# Patient Record
Sex: Female | Born: 2000 | Race: White | Hispanic: Yes | Marital: Married | Smoking: Never smoker
Health system: Southern US, Community
[De-identification: ages and names within clinical notes are randomized; demographics above are authoritative.]

## PROBLEM LIST (undated history)

## (undated) DIAGNOSIS — Z789 Other specified health status: Secondary | ICD-10-CM

## (undated) DIAGNOSIS — N200 Calculus of kidney: Secondary | ICD-10-CM

## (undated) HISTORY — PX: NO PAST SURGERIES: SHX2092

---

## 2020-05-29 NOTE — L&D Delivery Note (Signed)
Delivery Note Labor onset: 05/13/2021 Labor Onset Time: 1400 Complete dilation at 3:05 AM  Onset of pushing at 0305 FHR second stage Cat 1 Analgesia/Anesthesia intrapartum: epidural  Guided pushing with maternal urge. Delivery of a viable female at 74. Fetal head delivered in ROA position.  Nuchal cord: none.  Infant placed on maternal abd, dried, and tactile stim.  Cord double clamped after 45 seconds to bring baby to warmer and cut by mom.  2RNs present for birth.  Cord blood sample collected: yes Arterial cord blood sample collected: no  Placenta delivered Schultz intact, with 3 VC.  Placenta to labor and delivery. Uterine tone firm, bleeding light  Second degree laceration identified.  Anesthesia: epidural Repair: Repaired with 3-0 Vicryl in usual fashion QBL/EBL (mL): 250 Complications: none APGAR: APGAR (1 MIN): 6   APGAR (5 MINS): 8   APGAR (10 MINS):   Mom to postpartum.  Baby to Couplet care / Skin to Skin.  Oley Balm MSN, CNM 05/13/2021, 4:56 AM

## 2020-11-16 LAB — OB RESULTS CONSOLE HEPATITIS B SURFACE ANTIGEN: Hepatitis B Surface Ag: NEGATIVE

## 2020-11-16 LAB — OB RESULTS CONSOLE HIV ANTIBODY (ROUTINE TESTING): HIV: NONREACTIVE

## 2020-11-16 LAB — HEPATITIS C ANTIBODY: HCV Ab: NEGATIVE

## 2020-11-16 LAB — OB RESULTS CONSOLE RUBELLA ANTIBODY, IGM: Rubella: IMMUNE

## 2020-11-19 LAB — OB RESULTS CONSOLE GC/CHLAMYDIA
Chlamydia: NEGATIVE
Gonorrhea: NEGATIVE

## 2020-12-02 ENCOUNTER — Inpatient Hospital Stay (HOSPITAL_COMMUNITY): Admission: AD | Admit: 2020-12-02 | Payer: Self-pay | Source: Home / Self Care | Admitting: Obstetrics and Gynecology

## 2021-02-17 LAB — OB RESULTS CONSOLE RPR: RPR: NONREACTIVE

## 2021-03-06 ENCOUNTER — Other Ambulatory Visit: Payer: Self-pay

## 2021-03-06 ENCOUNTER — Inpatient Hospital Stay (HOSPITAL_COMMUNITY)

## 2021-03-06 ENCOUNTER — Encounter (HOSPITAL_COMMUNITY): Payer: Self-pay | Admitting: Obstetrics and Gynecology

## 2021-03-06 ENCOUNTER — Inpatient Hospital Stay (HOSPITAL_COMMUNITY)
Admission: AD | Admit: 2021-03-06 | Discharge: 2021-03-06 | Disposition: A | Discharge status: Home / Self Care | Attending: Obstetrics and Gynecology | Admitting: Obstetrics and Gynecology

## 2021-03-06 DIAGNOSIS — R1011 Right upper quadrant pain: Secondary | ICD-10-CM

## 2021-03-06 DIAGNOSIS — Z3A30 30 weeks gestation of pregnancy: Secondary | ICD-10-CM | POA: Diagnosis not present

## 2021-03-06 DIAGNOSIS — R1010 Upper abdominal pain, unspecified: Secondary | ICD-10-CM | POA: Diagnosis present

## 2021-03-06 DIAGNOSIS — O26833 Pregnancy related renal disease, third trimester: Secondary | ICD-10-CM | POA: Insufficient documentation

## 2021-03-06 DIAGNOSIS — N200 Calculus of kidney: Secondary | ICD-10-CM | POA: Diagnosis not present

## 2021-03-06 HISTORY — DX: Other specified health status: Z78.9

## 2021-03-06 LAB — URINALYSIS, ROUTINE W REFLEX MICROSCOPIC
Bilirubin Urine: NEGATIVE
Glucose, UA: NEGATIVE mg/dL
Ketones, ur: 5 mg/dL — AB
Nitrite: NEGATIVE
Protein, ur: 30 mg/dL — AB
RBC / HPF: >50 RBC/hpf — ABNORMAL HIGH (ref 0–5)
Specific Gravity, Urine: 1.016 (ref 1.005–1.030)
pH: 7 (ref 5.0–8.0)

## 2021-03-06 LAB — CBC WITH DIFFERENTIAL/PLATELET
Abs Immature Granulocytes: 0.05 10*3/uL (ref 0.00–0.07)
Basophils Absolute: 0 10*3/uL (ref 0.0–0.1)
Basophils Relative: 0 %
Eosinophils Absolute: 0.1 10*3/uL (ref 0.0–0.5)
Eosinophils Relative: 1 %
HCT: 34.3 % — ABNORMAL LOW (ref 36.0–46.0)
Hemoglobin: 11.7 g/dL — ABNORMAL LOW (ref 12.0–15.0)
Immature Granulocytes: 1 %
Lymphocytes Relative: 15 %
Lymphs Abs: 1.2 10*3/uL (ref 0.7–4.0)
MCH: 31.4 pg (ref 26.0–34.0)
MCHC: 34.1 g/dL (ref 30.0–36.0)
MCV: 92 fL (ref 80.0–100.0)
Monocytes Absolute: 0.8 10*3/uL (ref 0.1–1.0)
Monocytes Relative: 9 %
Neutro Abs: 6.3 10*3/uL (ref 1.7–7.7)
Neutrophils Relative %: 74 %
Platelets: 246 10*3/uL (ref 150–400)
RBC: 3.73 MIL/uL — ABNORMAL LOW (ref 3.87–5.11)
RDW: 12.1 % (ref 11.5–15.5)
WBC: 8.4 10*3/uL (ref 4.0–10.5)
nRBC: 0 % (ref 0.0–0.2)

## 2021-03-06 LAB — COMPREHENSIVE METABOLIC PANEL
ALT: 14 U/L (ref 0–44)
AST: 19 U/L (ref 15–41)
Albumin: 2.8 g/dL — ABNORMAL LOW (ref 3.5–5.0)
Alkaline Phosphatase: 82 U/L (ref 38–126)
Anion gap: 8 (ref 5–15)
BUN: 6 mg/dL (ref 6–20)
CO2: 23 mmol/L (ref 22–32)
Calcium: 8.9 mg/dL (ref 8.9–10.3)
Chloride: 103 mmol/L (ref 98–111)
Creatinine, Ser: 0.47 mg/dL (ref 0.44–1.00)
GFR, Estimated: >60 mL/min (ref >60)
Glucose, Bld: 109 mg/dL — ABNORMAL HIGH (ref 70–99)
Potassium: 4.2 mmol/L (ref 3.5–5.1)
Sodium: 134 mmol/L — ABNORMAL LOW (ref 135–145)
Total Bilirubin: 0.7 mg/dL (ref 0.3–1.2)
Total Protein: 6.1 g/dL — ABNORMAL LOW (ref 6.5–8.1)

## 2021-03-06 LAB — LIPASE, BLOOD: Lipase: 24 U/L (ref 11–51)

## 2021-03-06 LAB — AMYLASE: Amylase: 68 U/L (ref 28–100)

## 2021-03-06 MED ORDER — ACETAMINOPHEN 500 MG PO TABS
1000.0000 mg | ORAL_TABLET | Freq: Once | ORAL | Status: AC
  Administered 2021-03-06: 1000 mg via ORAL
  Filled 2021-03-06: qty 2

## 2021-03-06 MED ORDER — TAMSULOSIN HCL 0.4 MG PO CAPS
0.4000 mg | ORAL_CAPSULE | Freq: Every day | ORAL | 0 refills | Status: DC
Start: 2021-03-06 — End: 2021-05-14

## 2021-03-06 MED ORDER — CYCLOBENZAPRINE HCL 5 MG PO TABS
10.0000 mg | ORAL_TABLET | Freq: Once | ORAL | Status: AC
  Administered 2021-03-06: 10 mg via ORAL
  Filled 2021-03-06: qty 2

## 2021-03-06 MED ORDER — OXYCODONE-ACETAMINOPHEN 5-325 MG PO TABS: 1.0000 | ORAL_TABLET | Freq: Four times a day (QID) | ORAL | 0 refills | Status: DC | PRN

## 2021-03-06 NOTE — MAU Note (Signed)
Pt reports to mau with c/o right sided pain that radiates around to her mid back.  Pain is constant.  Pt reports the pain started about 4 hours ago.  Pt states she noticed her urine looked bloody when using the restroom in mau.  Pt states she also noticed a small amount of green dc when she wipes. Denies ctx or LOF. Denies vag bleeding +FM

## 2021-03-06 NOTE — Discharge Instructions (Signed)

## 2021-03-06 NOTE — MAU Provider Note (Signed)
History     CSN: [id number]  Arrival date and time: 03/06/21 1301   Event Date/Time   First Provider Initiated Contact with Patient 03/06/21 1413      Chief Complaint  Patient presents with   Abdominal Pain   HPI Tricia Mayo is a 20 y.o. G1P0 at [redacted]w[redacted]d who presents with severe upper abdominal pain. She states 4 hours ago she had a sudden onset of severe pain. She rates the pain a 8/10 and has not tried anything for the pain. She reports she ate an hour ago and it did not help or make the pain worse. She denies any bleeding or leaking. Reports normal fetal movement.   OB History     Gravida  1   Para      Term      Preterm      AB      Living         SAB      IAB      Ectopic      Multiple      Live Births              Past Medical History:  Diagnosis Date   Medical history non-contributory     Past Surgical History:  Procedure Laterality Date   NO PAST SURGERIES      No family history on file.  Social History   Tobacco Use   Smoking status: Never   Smokeless tobacco: Never  Vaping Use   Vaping Use: Never used  Substance Use Topics   Alcohol use: Not Currently   Drug use: Never    Allergies: No Known Allergies  Medications Prior to Admission  Medication Sig Dispense Refill Last Dose   prenatal vitamin w/FE, FA (PRENATAL 1 + 1) 27-1 MG TABS tablet Take 1 tablet by mouth daily at 12 noon.   03/06/2021    Review of Systems  Constitutional: Negative.  Negative for fatigue and fever.  HENT: Negative.    Respiratory: Negative.  Negative for shortness of breath.   Cardiovascular: Negative.  Negative for chest pain.  Gastrointestinal:  Positive for abdominal pain. Negative for constipation, diarrhea, nausea and vomiting.  Genitourinary: Negative.  Negative for dysuria, flank pain, vaginal bleeding and vaginal discharge.  Neurological: Negative.  Negative for dizziness and headaches.  Physical Exam   Blood pressure 114/62, pulse 93,  temperature 98.1 F (36.7 C), temperature source Oral, resp. rate 17, SpO2 99 %.  Physical Exam Vitals and nursing note reviewed.  Constitutional:      General: She is not in acute distress.    Appearance: She is well-developed.  HENT:     Head: Normocephalic.  Eyes:     Pupils: Pupils are equal, round, and reactive to light.  Cardiovascular:     Rate and Rhythm: Normal rate and regular rhythm.     Heart sounds: Normal heart sounds.  Pulmonary:     Effort: Pulmonary effort is normal. No respiratory distress.     Breath sounds: Normal breath sounds.  Abdominal:     General: Bowel sounds are normal. There is no distension.     Palpations: Abdomen is soft.     Tenderness: There is abdominal tenderness in the right upper quadrant. There is guarding.  Skin:    General: Skin is warm and dry.  Neurological:     Mental Status: She is alert and oriented to person, place, and time.  Psychiatric:  Mood and Affect: Mood normal.        Behavior: Behavior normal.        Thought Content: Thought content normal.        Judgment: Judgment normal.   Fetal Tracing:  Baseline: 130 Variability: moderate Accels: 15x15 Decels: none  Toco: none  Cervix: closed/thick/posterior  MAU Course  Procedures Results for orders placed or performed during the hospital encounter of 03/06/21 (from the past 24 hour(s))  Urinalysis, Routine w reflex microscopic Urine, Clean Catch     Status: Abnormal   Collection Time: 03/06/21  2:09 PM  Result Value Ref Range   Color, Urine YELLOW YELLOW   APPearance CLOUDY (A) CLEAR   Specific Gravity, Urine 1.016 1.005 - 1.030   pH 7.0 5.0 - 8.0   Glucose, UA NEGATIVE NEGATIVE mg/dL   Hgb urine dipstick LARGE (A) NEGATIVE   Bilirubin Urine NEGATIVE NEGATIVE   Ketones, ur 5 (A) NEGATIVE mg/dL   Protein, ur 30 (A) NEGATIVE mg/dL   Nitrite NEGATIVE NEGATIVE   Leukocytes,Ua LARGE (A) NEGATIVE   RBC / HPF >50 (H) 0 - 5 RBC/hpf   WBC, UA 11-20 0 - 5 WBC/hpf    Bacteria, UA MANY (A) NONE SEEN   Squamous Epithelial / LPF 21-50 0 - 5   Mucus PRESENT    Non Squamous Epithelial 0-5 (A) NONE SEEN  CBC with Differential/Platelet     Status: Abnormal   Collection Time: 03/06/21  2:22 PM  Result Value Ref Range   WBC 8.4 4.0 - 10.5 K/uL   RBC 3.73 (L) 3.87 - 5.11 MIL/uL   Hemoglobin 11.7 (L) 12.0 - 15.0 g/dL   HCT 56.2 (L) 13.0 - 86.5 %   MCV 92.0 80.0 - 100.0 fL   MCH 31.4 26.0 - 34.0 pg   MCHC 34.1 30.0 - 36.0 g/dL   RDW 78.4 69.6 - 29.5 %   Platelets 246 150 - 400 K/uL   nRBC 0.0 0.0 - 0.2 %   Neutrophils Relative % 74 %   Neutro Abs 6.3 1.7 - 7.7 K/uL   Lymphocytes Relative 15 %   Lymphs Abs 1.2 0.7 - 4.0 K/uL   Monocytes Relative 9 %   Monocytes Absolute 0.8 0.1 - 1.0 K/uL   Eosinophils Relative 1 %   Eosinophils Absolute 0.1 0.0 - 0.5 K/uL   Basophils Relative 0 %   Basophils Absolute 0.0 0.0 - 0.1 K/uL   Immature Granulocytes 1 %   Abs Immature Granulocytes 0.05 0.00 - 0.07 K/uL  Comprehensive metabolic panel     Status: Abnormal   Collection Time: 03/06/21  2:22 PM  Result Value Ref Range   Sodium 134 (L) 135 - 145 mmol/L   Potassium 4.2 3.5 - 5.1 mmol/L   Chloride 103 98 - 111 mmol/L   CO2 23 22 - 32 mmol/L   Glucose, Bld 109 (H) 70 - 99 mg/dL   BUN 6 6 - 20 mg/dL   Creatinine, Ser 2.84 0.44 - 1.00 mg/dL   Calcium 8.9 8.9 - 13.2 mg/dL   Total Protein 6.1 (L) 6.5 - 8.1 g/dL   Albumin 2.8 (L) 3.5 - 5.0 g/dL   AST 19 15 - 41 U/L   ALT 14 0 - 44 U/L   Alkaline Phosphatase 82 38 - 126 U/L   Total Bilirubin 0.7 0.3 - 1.2 mg/dL   GFR, Estimated >44 >01 mL/min   Anion gap 8 5 - 15  Lipase, blood     Status:  None   Collection Time: 03/06/21  2:22 PM  Result Value Ref Range   Lipase 24 11 - 51 U/L  Amylase     Status: None   Collection Time: 03/06/21  2:22 PM  Result Value Ref Range   Amylase 68 28 - 100 U/L    US Renal  Result Date: 03/06/2021 CLINICAL DATA:  Right upper quadrant pain EXAM: RENAL / URINARY TRACT  ULTRASOUND COMPLETE COMPARISON:  None. FINDINGS: Right Kidney: Renal measurements: 12 x 5.3 x 6.3 cm = volume: 209.6 mL. Normal echogenicity. Mild hydronephrosis. 10 mm proximal right ureteral calculus. Left Kidney: Renal measurements: 12.1 x 5.9 x 5.4 cm = volume: 198.7 mL. Normal echogenicity. No renal mass or hydronephrosis. Bladder: Appears normal for degree of bladder distention. Other: None. IMPRESSION: 1. A 10 mm right proximal ureteral calculus with mild hydronephrosis. Electronically Signed   By: Elige Ko M.D.   On: 03/06/2021 16:37   US Abdomen Limited RUQ (LIVER/GB)  Result Date: 03/06/2021 CLINICAL DATA:  Right upper quadrant pain for 4 hours, [redacted] weeks pregnant EXAM: ULTRASOUND ABDOMEN LIMITED RIGHT UPPER QUADRANT COMPARISON:  None. FINDINGS: Gallbladder: No gallstones or wall thickening visualized. No sonographic Murphy sign noted by sonographer. Common bile duct: Diameter: 2 mm Liver: No focal lesion identified. Within normal limits in parenchymal echogenicity. Portal vein is patent on color Doppler imaging with normal direction of blood flow towards the liver. Other: None. IMPRESSION: 1. Unremarkable right upper quadrant ultrasound. Electronically Signed   By: Sharlet Salina M.D.   On: 03/06/2021 15:07     MDM UA CBC with Diff CMP, Amylase, Lipase [place] RUQ US Renal  Kidney stone precautions reviewed at length. Discussed why to return to MAU  Assessment and Plan   1. Renal calculus   2. Right upper quadrant pain   3. [redacted] weeks gestation of pregnancy    -Discharge home in stable condition -Rx for percocet and flomax sent to patient's pharmacy -Kidney stone precautions discussed -Patient advised to follow-up with OB as scheduled for prenatal care or sooner if pain does not improve -Patient may return to MAU as needed or if her condition were to change or worsen   Rolm Bookbinder CNM 03/06/2021, 2:13 PM

## 2021-03-06 NOTE — Progress Notes (Signed)
Pt went to PCP last Friday and was seen for aches and headache and nasal congestion, tested for flu and covid, both negative. Pt DC w/ diagnoses of virus.

## 2021-03-07 LAB — CULTURE, OB URINE: Culture: >=100000 — AB

## 2021-03-20 ENCOUNTER — Encounter (HOSPITAL_COMMUNITY): Payer: Self-pay | Admitting: Obstetrics & Gynecology

## 2021-03-20 ENCOUNTER — Inpatient Hospital Stay (HOSPITAL_COMMUNITY)
Admission: AD | Admit: 2021-03-20 | Discharge: 2021-03-20 | Disposition: A | Discharge status: Home / Self Care | Attending: Obstetrics & Gynecology | Admitting: Obstetrics & Gynecology

## 2021-03-20 ENCOUNTER — Other Ambulatory Visit: Payer: Self-pay | Admitting: Obstetrics & Gynecology

## 2021-03-20 ENCOUNTER — Other Ambulatory Visit: Payer: Self-pay

## 2021-03-20 DIAGNOSIS — R319 Hematuria, unspecified: Secondary | ICD-10-CM | POA: Diagnosis not present

## 2021-03-20 DIAGNOSIS — Z3A32 32 weeks gestation of pregnancy: Secondary | ICD-10-CM | POA: Diagnosis not present

## 2021-03-20 DIAGNOSIS — B3731 Acute candidiasis of vulva and vagina: Secondary | ICD-10-CM | POA: Insufficient documentation

## 2021-03-20 DIAGNOSIS — Z87442 Personal history of urinary calculi: Secondary | ICD-10-CM | POA: Insufficient documentation

## 2021-03-20 DIAGNOSIS — O26893 Other specified pregnancy related conditions, third trimester: Secondary | ICD-10-CM | POA: Insufficient documentation

## 2021-03-20 DIAGNOSIS — O98813 Other maternal infectious and parasitic diseases complicating pregnancy, third trimester: Secondary | ICD-10-CM | POA: Insufficient documentation

## 2021-03-20 DIAGNOSIS — Z3689 Encounter for other specified antenatal screening: Secondary | ICD-10-CM | POA: Diagnosis not present

## 2021-03-20 LAB — URINALYSIS, ROUTINE W REFLEX MICROSCOPIC
Bilirubin Urine: NEGATIVE
Glucose, UA: NEGATIVE mg/dL
Ketones, ur: NEGATIVE mg/dL
Nitrite: NEGATIVE
Protein, ur: NEGATIVE mg/dL
RBC / HPF: >50 RBC/hpf — ABNORMAL HIGH (ref 0–5)
Specific Gravity, Urine: 1.009 (ref 1.005–1.030)
pH: 7 (ref 5.0–8.0)

## 2021-03-20 LAB — WET PREP, GENITAL
Clue Cells Wet Prep HPF POC: NONE SEEN
Sperm: NONE SEEN
Trich, Wet Prep: NONE SEEN
Yeast Wet Prep HPF POC: NONE SEEN

## 2021-03-20 LAB — POCT FERN TEST: POCT Fern Test: NEGATIVE

## 2021-03-20 MED ORDER — TERCONAZOLE 0.4 % VA CREA: 1.0000 | TOPICAL_CREAM | Freq: Every day | VAGINAL | 0 refills | Status: DC

## 2021-03-20 NOTE — MAU Note (Signed)
Pt reports blood in her urine that started yesterday.  Pt reports feeling a gush the day before yesterday and when she checked her underwear she saw gelatin like substance. Pt states her mom told her to change her underwear to see if it would be wet again. Pt denies LOF since that time.

## 2021-03-20 NOTE — MAU Provider Note (Signed)
History     CSN: [id number]  Arrival date and time: 03/20/21 1806   Event Date/Time   First Provider Initiated Contact with Patient 03/20/21 1832      Chief Complaint  Patient presents with   Hematuria   20 y.o. G1 @32 .1 wks presenting with cramping, LOF, and blood in urine. She had small gush of clear fluid 2 days ago. No leaking since. No recent IC. Denies itching or malodor. Started having low abdominal cramping and blood in the urine yesterday. Was dx with kidney stone earlier this month. She stopped taking Flomax. Denies other urinary sx. Reports good FM.    OB History     Gravida  1   Para      Term      Preterm      AB      Living         SAB      IAB      Ectopic      Multiple      Live Births              Past Medical History:  Diagnosis Date   Medical history non-contributory     Past Surgical History:  Procedure Laterality Date   NO PAST SURGERIES      History reviewed. No pertinent family history.  Social History   Tobacco Use   Smoking status: Never   Smokeless tobacco: Never  Vaping Use   Vaping Use: Never used  Substance Use Topics   Alcohol use: Not Currently   Drug use: Never    Allergies: No Known Allergies  No medications prior to admission.    Review of Systems  Gastrointestinal:  Positive for abdominal pain.  Genitourinary:  Positive for hematuria and vaginal discharge. Negative for dysuria and vaginal bleeding.  Physical Exam   Blood pressure 118/61, pulse 94, temperature 98.4 F (36.9 C), temperature source Oral, resp. rate 12, SpO2 99 %.  Physical Exam Vitals and nursing note reviewed. Exam conducted with a chaperone present.  Constitutional:      General: She is not in acute distress.    Appearance: Normal appearance.  HENT:     Head: Atraumatic.  Pulmonary:     Effort: Pulmonary effort is normal. No respiratory distress.  Abdominal:     Palpations: Abdomen is soft.     Tenderness: There is no  abdominal tenderness.     Comments: gravid  Genitourinary:    Comments: SSE: yellow adherent curdy discharge w/watery discharge; fern neg SVE: closed/thick Musculoskeletal:        General: Normal range of motion.     Cervical back: Normal range of motion.  Skin:    General: Skin is warm and dry.  Neurological:     General: No focal deficit present.     Mental Status: She is alert and oriented to person, place, and time.  Psychiatric:        Mood and Affect: Mood normal.        Behavior: Behavior normal.  EFM: 145 bpm, mod variability, + accels, no decels Toco: none  Results for orders placed or performed during the hospital encounter of 03/20/21 (from the past 24 hour(s))  Urinalysis, Routine w reflex microscopic Urine, Clean Catch     Status: Abnormal   Collection Time: 03/20/21  6:09 PM  Result Value Ref Range   Color, Urine YELLOW YELLOW   APPearance CLEAR CLEAR   Specific Gravity, Urine 1.009 1.005 - 1.030  pH 7.0 5.0 - 8.0   Glucose, UA NEGATIVE NEGATIVE mg/dL   Hgb urine dipstick LARGE (A) NEGATIVE   Bilirubin Urine NEGATIVE NEGATIVE   Ketones, ur NEGATIVE NEGATIVE mg/dL   Protein, ur NEGATIVE NEGATIVE mg/dL   Nitrite NEGATIVE NEGATIVE   Leukocytes,Ua MODERATE (A) NEGATIVE   RBC / HPF >50 (H) 0 - 5 RBC/hpf   WBC, UA 21-50 0 - 5 WBC/hpf   Bacteria, UA FEW (A) NONE SEEN   Squamous Epithelial / LPF 0-5 0 - 5   Mucus PRESENT   Wet prep, genital     Status: Abnormal   Collection Time: 03/20/21  6:59 PM  Result Value Ref Range   Yeast Wet Prep HPF POC NONE SEEN NONE SEEN   Trich, Wet Prep NONE SEEN NONE SEEN   Clue Cells Wet Prep HPF POC NONE SEEN NONE SEEN   WBC, Wet Prep HPF POC MANY (A) NONE SEEN   Sperm NONE SEEN   POCT fern test     Status: None   Collection Time: 03/20/21  8:29 PM  Result Value Ref Range   POCT Fern Test Negative = intact amniotic membranes    MAU Course  Procedures  MDM Labs ordered and reviewed. No signs of PTL or SROM. Will treat for  yeast. Pt informed she may have hematuria until kidney stone passes and recommend she take Flomax daily. Stable for discharge home.   Assessment and Plan   1. [redacted] weeks gestation of pregnancy   2. NST (non-stress test) reactive   3. Yeast vaginitis    Discharge home Follow up at [facility] as scheduled Rx Terazol Return precautions  Allergies as of 03/20/2021   No Known Allergies      Medication List     TAKE these medications    oxyCODONE-acetaminophen 5-325 MG tablet Commonly known as: PERCOCET/ROXICET Take 1 tablet by mouth every 6 (six) hours as needed for severe pain.   prenatal vitamin w/FE, FA 27-1 MG Tabs tablet Take 1 tablet by mouth daily at 12 noon.   tamsulosin 0.4 MG Caps capsule Commonly known as: Flomax Take 1 capsule (0.4 mg total) by mouth daily.   terconazole 0.4 % vaginal cream Commonly known as: TERAZOL 7 Place 1 applicator vaginally at bedtime.       Donette Larry, CNM 03/20/2021, 8:43 PM

## 2021-03-21 LAB — MOLECULAR ANCILLARY ONLY
Chlamydia: NEGATIVE
Comment: NEGATIVE
Comment: NORMAL
Neisseria Gonorrhea: NEGATIVE

## 2021-03-22 LAB — CULTURE, OB URINE

## 2021-03-26 ENCOUNTER — Other Ambulatory Visit: Payer: Self-pay

## 2021-03-26 ENCOUNTER — Inpatient Hospital Stay (HOSPITAL_COMMUNITY)
Admission: AD | Admit: 2021-03-26 | Discharge: 2021-03-30 | DRG: 818 | Disposition: A | Discharge status: Home / Self Care | Attending: Obstetrics and Gynecology | Admitting: Obstetrics and Gynecology

## 2021-03-26 ENCOUNTER — Encounter (HOSPITAL_COMMUNITY): Payer: Self-pay | Admitting: Obstetrics and Gynecology

## 2021-03-26 ENCOUNTER — Inpatient Hospital Stay (HOSPITAL_COMMUNITY)

## 2021-03-26 DIAGNOSIS — R109 Unspecified abdominal pain: Secondary | ICD-10-CM

## 2021-03-26 DIAGNOSIS — O2303 Infections of kidney in pregnancy, third trimester: Principal | ICD-10-CM | POA: Diagnosis present

## 2021-03-26 DIAGNOSIS — N136 Pyonephrosis: Secondary | ICD-10-CM | POA: Diagnosis present

## 2021-03-26 DIAGNOSIS — N12 Tubulo-interstitial nephritis, not specified as acute or chronic: Secondary | ICD-10-CM | POA: Diagnosis present

## 2021-03-26 DIAGNOSIS — N200 Calculus of kidney: Secondary | ICD-10-CM

## 2021-03-26 DIAGNOSIS — N10 Acute tubulo-interstitial nephritis: Secondary | ICD-10-CM | POA: Diagnosis present

## 2021-03-26 DIAGNOSIS — Z87442 Personal history of urinary calculi: Secondary | ICD-10-CM

## 2021-03-26 DIAGNOSIS — Z3A33 33 weeks gestation of pregnancy: Secondary | ICD-10-CM

## 2021-03-26 DIAGNOSIS — Z20822 Contact with and (suspected) exposure to covid-19: Secondary | ICD-10-CM | POA: Diagnosis present

## 2021-03-26 DIAGNOSIS — Z936 Other artificial openings of urinary tract status: Secondary | ICD-10-CM

## 2021-03-26 HISTORY — DX: Calculus of kidney: N20.0

## 2021-03-26 LAB — COMPREHENSIVE METABOLIC PANEL
ALT: 14 U/L (ref 0–44)
AST: 22 U/L (ref 15–41)
Albumin: 3 g/dL — ABNORMAL LOW (ref 3.5–5.0)
Alkaline Phosphatase: 103 U/L (ref 38–126)
Anion gap: 10 (ref 5–15)
BUN: <5 mg/dL — ABNORMAL LOW (ref 6–20)
CO2: 22 mmol/L (ref 22–32)
Calcium: 9.1 mg/dL (ref 8.9–10.3)
Chloride: 103 mmol/L (ref 98–111)
Creatinine, Ser: 0.65 mg/dL (ref 0.44–1.00)
GFR, Estimated: >60 mL/min (ref >60)
Glucose, Bld: 97 mg/dL (ref 70–99)
Potassium: 3.8 mmol/L (ref 3.5–5.1)
Sodium: 135 mmol/L (ref 135–145)
Total Bilirubin: 0.9 mg/dL (ref 0.3–1.2)
Total Protein: 6.5 g/dL (ref 6.5–8.1)

## 2021-03-26 LAB — URINALYSIS, ROUTINE W REFLEX MICROSCOPIC
Bilirubin Urine: NEGATIVE
Glucose, UA: NEGATIVE mg/dL
Hgb urine dipstick: NEGATIVE
Ketones, ur: 80 mg/dL — AB
Nitrite: NEGATIVE
Protein, ur: NEGATIVE mg/dL
Specific Gravity, Urine: 1.015 (ref 1.005–1.030)
pH: 6 (ref 5.0–8.0)

## 2021-03-26 LAB — CBC WITH DIFFERENTIAL/PLATELET
Abs Immature Granulocytes: 0.1 10*3/uL — ABNORMAL HIGH (ref 0.00–0.07)
Basophils Absolute: 0 10*3/uL (ref 0.0–0.1)
Basophils Relative: 0 %
Eosinophils Absolute: 0 10*3/uL (ref 0.0–0.5)
Eosinophils Relative: 0 %
HCT: 38.3 % (ref 36.0–46.0)
Hemoglobin: 12.5 g/dL (ref 12.0–15.0)
Immature Granulocytes: 1 %
Lymphocytes Relative: 8 %
Lymphs Abs: 1.1 10*3/uL (ref 0.7–4.0)
MCH: 30.8 pg (ref 26.0–34.0)
MCHC: 32.6 g/dL (ref 30.0–36.0)
MCV: 94.3 fL (ref 80.0–100.0)
Monocytes Absolute: 1.1 10*3/uL — ABNORMAL HIGH (ref 0.1–1.0)
Monocytes Relative: 8 %
Neutro Abs: 11.4 10*3/uL — ABNORMAL HIGH (ref 1.7–7.7)
Neutrophils Relative %: 83 %
Platelets: 244 10*3/uL (ref 150–400)
RBC: 4.06 MIL/uL (ref 3.87–5.11)
RDW: 12.1 % (ref 11.5–15.5)
WBC: 13.8 10*3/uL — ABNORMAL HIGH (ref 4.0–10.5)
nRBC: 0 % (ref 0.0–0.2)

## 2021-03-26 MED ORDER — SODIUM CHLORIDE 0.9 % IV SOLN: 250.0000 mL | INTRAVENOUS | Status: DC | PRN

## 2021-03-26 MED ORDER — PROMETHAZINE HCL 25 MG/ML IJ SOLN
25.0000 mg | Freq: Once | INTRAMUSCULAR | Status: AC
  Administered 2021-03-26: 25 mg via INTRAMUSCULAR
  Filled 2021-03-26: qty 1

## 2021-03-26 MED ORDER — DOCUSATE SODIUM 100 MG PO CAPS
100.0000 mg | ORAL_CAPSULE | Freq: Every day | ORAL | Status: DC
  Administered 2021-03-27 – 2021-03-30 (×4): 100 mg via ORAL
  Filled 2021-03-26 (×4): qty 1

## 2021-03-26 MED ORDER — PRENATAL MULTIVITAMIN CH
1.0000 | ORAL_TABLET | Freq: Every day | ORAL | Status: DC
  Administered 2021-03-28 – 2021-03-30 (×2): 1 via ORAL
  Filled 2021-03-26 (×3): qty 1

## 2021-03-26 MED ORDER — MORPHINE SULFATE (PF) 4 MG/ML IV SOLN
1.0000 mg | INTRAVENOUS | Status: DC | PRN
Start: 2021-03-26 — End: 2021-03-27
  Filled 2021-03-26: qty 1

## 2021-03-26 MED ORDER — ACETAMINOPHEN 325 MG PO TABS
650.0000 mg | ORAL_TABLET | ORAL | Status: DC | PRN
  Administered 2021-03-28: 650 mg via ORAL
  Filled 2021-03-26: qty 2

## 2021-03-26 MED ORDER — SODIUM CHLORIDE 0.9% FLUSH: 3.0000 mL | INTRAVENOUS | Status: DC | PRN

## 2021-03-26 MED ORDER — LACTATED RINGERS IV BOLUS
1000.0000 mL | Freq: Once | INTRAVENOUS | Status: AC
  Administered 2021-03-26: 1000 mL via INTRAVENOUS

## 2021-03-26 MED ORDER — CALCIUM CARBONATE ANTACID 500 MG PO CHEW: 2.0000 | CHEWABLE_TABLET | ORAL | Status: DC | PRN

## 2021-03-26 MED ORDER — HYDROMORPHONE HCL 1 MG/ML IJ SOLN
1.0000 mg | Freq: Once | INTRAMUSCULAR | Status: AC
  Administered 2021-03-26: 1 mg via INTRAVENOUS
  Filled 2021-03-26: qty 1

## 2021-03-26 MED ORDER — OXYCODONE HCL 5 MG PO TABS
5.0000 mg | ORAL_TABLET | ORAL | Status: DC | PRN
  Administered 2021-03-27 – 2021-03-28 (×7): 5 mg via ORAL
  Filled 2021-03-26 (×7): qty 1

## 2021-03-26 MED ORDER — ONDANSETRON HCL 4 MG/2ML IJ SOLN
4.0000 mg | Freq: Three times a day (TID) | INTRAMUSCULAR | Status: DC | PRN
  Administered 2021-03-28 – 2021-03-29 (×4): 4 mg via INTRAVENOUS
  Filled 2021-03-26 (×4): qty 2

## 2021-03-26 MED ORDER — ONDANSETRON HCL 4 MG/2ML IJ SOLN
4.0000 mg | Freq: Once | INTRAMUSCULAR | Status: AC
  Administered 2021-03-26: 4 mg via INTRAVENOUS
  Filled 2021-03-26: qty 2

## 2021-03-26 MED ORDER — HYDROMORPHONE HCL 1 MG/ML IJ SOLN
1.0000 mg | Freq: Once | INTRAMUSCULAR | Status: AC
  Administered 2021-03-27: 1 mg via INTRAVENOUS
  Filled 2021-03-26 (×2): qty 1

## 2021-03-26 MED ORDER — SODIUM CHLORIDE 0.9% FLUSH
3.0000 mL | Freq: Two times a day (BID) | INTRAVENOUS | Status: DC
Start: 2021-03-27 — End: 2021-03-30
  Administered 2021-03-27 – 2021-03-28 (×3): 3 mL via INTRAVENOUS

## 2021-03-26 NOTE — MAU Note (Signed)
Pt reports to mau with c/o sudden onset of lower right back pain that is constant and sharp.   Pt denies ctx or LOF.  Reports seeing green dc today.  Reports hx of kidney stones about 1 month ago.  +FM

## 2021-03-26 NOTE — MAU Provider Note (Addendum)
History     CSN: [id number]  Arrival date and time: 03/26/21 1821   Event Date/Time   First Provider Initiated Contact with Patient 03/26/21 1942      Chief Complaint  Patient presents with   Back Pain   Nausea   Vaginal Discharge   HPI  Ms.Zeinab Rodwell is a 20 y.o. female G1P0 @ [redacted]w[redacted]d here in MAU with right flank pain. The pain started this morning at 11:00. The pain is constant. She took Oxycodone 1 tablet at 1400 which did not touch the pain. She currently rates her pain 9/10. The pain is sharp. Hx of kidney stones, last renal US was 03/06/2021 which showed a 10 mm right proximal ureteral calculus.    OB History     Gravida  1   Para      Term      Preterm      AB      Living         SAB      IAB      Ectopic      Multiple      Live Births              Past Medical History:  Diagnosis Date   Kidney stones     Past Surgical History:  Procedure Laterality Date   NO PAST SURGERIES      No family history on file.  Social History   Tobacco Use   Smoking status: Never   Smokeless tobacco: Never  Vaping Use   Vaping Use: Never used  Substance Use Topics   Alcohol use: Not Currently   Drug use: Never    Allergies: No Known Allergies  Medications Prior to Admission  Medication Sig Dispense Refill Last Dose   oxyCODONE-acetaminophen (PERCOCET/ROXICET) 5-325 MG tablet Take 1 tablet by mouth every 6 (six) hours as needed for severe pain. 20 tablet 0 03/26/2021 at 1400   prenatal vitamin w/FE, FA (PRENATAL 1 + 1) 27-1 MG TABS tablet Take 1 tablet by mouth daily at 12 noon.   03/25/2021   tamsulosin (FLOMAX) 0.4 MG CAPS capsule Take 1 capsule (0.4 mg total) by mouth daily. 30 capsule 0 03/26/2021 at 1400   terconazole (TERAZOL 7) 0.4 % vaginal cream Place 1 applicator vaginally at bedtime. 45 g 0 03/25/2021   Results for orders placed or performed during the hospital encounter of 03/26/21 (from the past 48 hour(s))  Urinalysis, Routine w  reflex microscopic Urine, Clean Catch     Status: Abnormal   Collection Time: 03/26/21  6:33 PM  Result Value Ref Range   Color, Urine YELLOW YELLOW   APPearance HAZY (A) CLEAR   Specific Gravity, Urine 1.015 1.005 - 1.030   pH 6.0 5.0 - 8.0   Glucose, UA NEGATIVE NEGATIVE mg/dL   Hgb urine dipstick NEGATIVE NEGATIVE   Bilirubin Urine NEGATIVE NEGATIVE   Ketones, ur 80 (A) NEGATIVE mg/dL   Protein, ur NEGATIVE NEGATIVE mg/dL   Nitrite NEGATIVE NEGATIVE   Leukocytes,Ua TRACE (A) NEGATIVE   RBC / HPF 11-20 0 - 5 RBC/hpf   WBC, UA 21-50 0 - 5 WBC/hpf   Bacteria, UA RARE (A) NONE SEEN   Squamous Epithelial / LPF 6-10 0 - 5   Mucus PRESENT     Comment: Performed at [facility], [address], [place], [place] [address]    Review of Systems  Constitutional:  Negative for fever.  Gastrointestinal:  Positive for nausea. Negative  for vomiting.  Genitourinary:  Positive for flank pain.  Musculoskeletal:  Positive for back pain.  Physical Exam   Blood pressure 126/78, pulse (!) 101, temperature 98.6 F (37 C), temperature source Oral, resp. rate 18, SpO2 99 %.  Physical Exam Vitals and nursing note reviewed.  Constitutional:      General: She is in acute distress.     Appearance: Normal appearance. She is not toxic-appearing.  HENT:     Head: Normocephalic.  Abdominal:     Palpations: Abdomen is soft.     Tenderness: There is no abdominal tenderness. There is right CVA tenderness.  Skin:    General: Skin is warm.  Neurological:     Mental Status: She is alert and oriented to person, place, and time.  Psychiatric:        Mood and Affect: Mood normal.   Fetal Tracing: Baseline: 145 bpm Variability: Moderate  Accelerations: 15x15 Decelerations: None Toco: 1 contraction noted.   US RENAL  Result Date: 03/26/2021 CLINICAL DATA:  Right flank pain. EXAM: RENAL / URINARY TRACT ULTRASOUND COMPLETE COMPARISON:  None. FINDINGS: Right Kidney: Renal measurements: 12.4 cm x  6.5 cm x 7.2 cm = volume: 302.5 mL. Echogenicity within normal limits. A 4.8 mm nonobstructing renal stone is seen within the lower pole of the right kidney. Moderate severity right-sided hydronephrosis is noted. No mass is visualized. Left Kidney: Renal measurements: 11.4 cm x 6.4 cm x 5.4 cm = volume: 204.1 mL. Echogenicity within normal limits. Multiple shadowing echogenic renal calculi are seen within the left kidney. The largest measures approximately 8.6 mm no mass or hydronephrosis visualized. Bladder: Appears normal for degree of bladder distention. The left ureteral jet is visualized. Other: A 1.1 cm x 0.6 cm x 0.8 cm obstructing renal calculus is visualized within the distal right ureter. IMPRESSION: 1. Distal right-sided ureteral obstructing renal stone, as described above. 2. Subsequent moderate severity right-sided hydronephrosis. 3. Bilateral subcentimeter nonobstructing renal calculi. Electronically Signed   By: Aram Candela M.D.   On: 03/26/2021 22:30    MAU Course   MDM  LR bolus Dilaudid & Phenergan given IV Renal US ordered Report given to Camelia Eng CNM who resumes care of the patient. Patient is awaiting renal US.   Patient requiring more pain medication once returned from ultrasound Obstructing right ureteral stone with moderate hydronephrosis D/w Dr. Mena Goes with urology who recommends admission for pain control with urology consult in the morning Contacted Dr. Connye Burkitt regarding recommendations. Patient to be admitted to [facility]. Dr. Connye Burkitt to place admission orders. Assessment and Plan  - Admit to [facility] - Dr. Connye Burkitt assumes care of patient at this time    Brand Males, CNM 03/27/21 12:12 AM

## 2021-03-27 ENCOUNTER — Observation Stay (HOSPITAL_COMMUNITY)

## 2021-03-27 ENCOUNTER — Encounter (HOSPITAL_COMMUNITY)
Admission: AD | Disposition: A | Discharge status: Home / Self Care | Payer: Self-pay | Source: Home / Self Care | Attending: Obstetrics and Gynecology

## 2021-03-27 ENCOUNTER — Encounter (HOSPITAL_COMMUNITY): Payer: Self-pay | Admitting: Certified Registered"

## 2021-03-27 DIAGNOSIS — N10 Acute tubulo-interstitial nephritis: Secondary | ICD-10-CM | POA: Diagnosis present

## 2021-03-27 DIAGNOSIS — O2303 Infections of kidney in pregnancy, third trimester: Secondary | ICD-10-CM | POA: Diagnosis present

## 2021-03-27 DIAGNOSIS — Z20822 Contact with and (suspected) exposure to covid-19: Secondary | ICD-10-CM | POA: Diagnosis present

## 2021-03-27 DIAGNOSIS — Z936 Other artificial openings of urinary tract status: Secondary | ICD-10-CM | POA: Diagnosis not present

## 2021-03-27 DIAGNOSIS — Z87442 Personal history of urinary calculi: Secondary | ICD-10-CM | POA: Diagnosis not present

## 2021-03-27 DIAGNOSIS — N136 Pyonephrosis: Secondary | ICD-10-CM | POA: Diagnosis present

## 2021-03-27 DIAGNOSIS — Z3A33 33 weeks gestation of pregnancy: Secondary | ICD-10-CM | POA: Diagnosis not present

## 2021-03-27 DIAGNOSIS — N200 Calculus of kidney: Secondary | ICD-10-CM | POA: Diagnosis present

## 2021-03-27 HISTORY — PX: IR NEPHROSTOMY PLACEMENT RIGHT: IMG6064

## 2021-03-27 LAB — RESP PANEL BY RT-PCR (FLU A&B, COVID) ARPGX2
Influenza A by PCR: NEGATIVE
Influenza B by PCR: NEGATIVE
SARS Coronavirus 2 by RT PCR: NEGATIVE

## 2021-03-27 LAB — TYPE AND SCREEN
ABO/RH(D): B POS
Antibody Screen: NEGATIVE

## 2021-03-27 SURGERY — CYSTOURETEROSCOPY, WITH RETROGRADE PYELOGRAM AND STENT INSERTION
Anesthesia: General | Laterality: Right

## 2021-03-27 MED ORDER — IOHEXOL 300 MG/ML  SOLN
50.0000 mL | Freq: Once | INTRAMUSCULAR | Status: AC | PRN
  Administered 2021-03-27: 7 mL

## 2021-03-27 MED ORDER — FENTANYL CITRATE (PF) 100 MCG/2ML IJ SOLN
INTRAMUSCULAR | Status: AC | PRN
  Administered 2021-03-27 (×2): 25 ug via INTRAVENOUS

## 2021-03-27 MED ORDER — LIDOCAINE HCL 1 % IJ SOLN
INTRAMUSCULAR | Status: AC
  Administered 2021-03-27: 10 mL
  Filled 2021-03-27: qty 20

## 2021-03-27 MED ORDER — FENTANYL CITRATE (PF) 100 MCG/2ML IJ SOLN
INTRAMUSCULAR | Status: AC
  Filled 2021-03-27: qty 2

## 2021-03-27 MED ORDER — LACTATED RINGERS IV SOLN: INTRAVENOUS | Status: DC

## 2021-03-27 MED ORDER — ACETAMINOPHEN 500 MG PO TABS
1000.0000 mg | ORAL_TABLET | Freq: Once | ORAL | Status: AC
  Administered 2021-03-27: 1000 mg via ORAL
  Filled 2021-03-27: qty 2

## 2021-03-27 MED ORDER — MORPHINE SULFATE (PF) 2 MG/ML IV SOLN
1.0000 mg | INTRAVENOUS | Status: DC | PRN
  Administered 2021-03-27 (×2): 1 mg via INTRAVENOUS
  Filled 2021-03-27 (×2): qty 1

## 2021-03-27 MED ORDER — FLUTICASONE PROPIONATE 50 MCG/ACT NA SUSP
1.0000 | Freq: Two times a day (BID) | NASAL | Status: DC
  Administered 2021-03-28 – 2021-03-29 (×5): 1 via NASAL
  Filled 2021-03-27: qty 16

## 2021-03-27 MED ORDER — MIDAZOLAM HCL 2 MG/2ML IJ SOLN
INTRAMUSCULAR | Status: AC | PRN
  Administered 2021-03-27 (×2): 1 mg via INTRAVENOUS

## 2021-03-27 MED ORDER — MIDAZOLAM HCL 2 MG/2ML IJ SOLN
INTRAMUSCULAR | Status: AC
  Filled 2021-03-27: qty 2

## 2021-03-27 MED ORDER — SODIUM CHLORIDE 0.9 % IV SOLN
2.0000 g | Freq: Once | INTRAVENOUS | Status: AC
  Administered 2021-03-27: 2 g via INTRAVENOUS
  Filled 2021-03-27: qty 20

## 2021-03-27 NOTE — H&P (Signed)
Interventional Radiology - Pre-procedure H&P    Referring Provider: Drema Dallas, DO  Planned Procedure: Right PCN placement     History of Present Illness  Tricia Mayo is a 20 y.o. female currently pregnant seen in consultation for obstructed right nephrolithiasis and concern for infection. [place] today showed obstructing calculus with moderate to severe hydronephrosis. She is tachycardic, low-grade fever, with leukocytosis. I spoke with the urologist and obstetrician, and are in agreement that she would most benefit from percutaneous urinary diversion.    Additional Past Medical History Past Medical History:  Diagnosis Date   Kidney stones     Surgical History  Past Surgical History:  Procedure Laterality Date   NO PAST SURGERIES       Medications  I have reviewed the current medication list. Refer to chart for details. Current Outpatient Medications  Medication Instructions   oxyCODONE-acetaminophen (PERCOCET/ROXICET) 5-325 MG tablet 1 tablet, Oral, Every 6 hours PRN   prenatal vitamin w/FE, FA (PRENATAL 1 + 1) 27-1 MG TABS tablet 1 tablet, Oral, Daily   tamsulosin (FLOMAX) 0.4 mg, Oral, Daily   terconazole (TERAZOL 7) 0.4 % vaginal cream 1 applicator, Vaginal, Daily at bedtime      Allergies No Known Allergies Does patient have contrast allergy: No     Physical Exam Current Vitals Temp: (!) 100.8 F (38.2 C) (Temp Source: Oral)  Pulse Rate: (!) 120  Resp: 18  BP: 131/64  SpO2: 96 %  Height: 5\' 5"  (165.1 cm)  Weight: 83.5 kg  Body mass index is 30.62 kg/m.  General: Alert and answers questions appropriately.  Mallampati score: I (soft palate, uvula, fauces, and tonsillar pillars visible) Cardiac: Tachycardic.  Pulmonary: Normal work of breathing.  Abdominal: Pregnant.  Extremities: Normally-formed, well perfused.    Pertinent Lab Results Labs: CBC: WBC/Hgb/Hct/Plts:  13.8/12.5/38.3/244 (10/29 1952)  BMP: BUN/Cr/glu/ALT/AST/amyl/lip:   <5/0.65/--/14/22/--/-- (10/29 1952) Coagulation:    CBC Trends: Recent Labs    03/26/21 1952  WBC 13.8*  HGB 12.5  HCT 38.3  PLT 244     Creatinine Trend: Recent Labs    03/26/21 1952  CREATININE 0.65     Relevant and/or Recent Imaging: Renal US, reviewed    Assessment & Plan Tricia Mayo is a 20 y.o. female currently pregnant seen in consultation for obstructed right nephrolithiasis and concern for infection. Plan for right percutaneous nephrostomy (PCN) placement.    Patient is appropriate candidate for PCN placement with moderate sedation. Risks and benefits discussed, including but not limited to procedure-specific risks of renal or vascular injury, and patient is agreeable to proceed. There is near negligible risk of significant radiation to the fetus in this procedure (significant radiation threshold being defined as 25mG y exposure to the fetus which is estimated to increase risk of childhood cancer by ~1%). .    Procedure Checklist:  Consent obtained: Risks of the procedure as well as the alternatives and risks of each were explained to the patient and/or caregiver.  Consent for the procedure was obtained and is signed in the bedside chart Consent obtained from: The patient Patient is appropriate candidate for sedation Yes ASA Classification: ASA 2 - Patient with mild systemic disease with no functional limitations NPO status: 1100  Code status:   Code Status: Full Code Pre-procedural prep necessary: n/a     I spent a total of 20 Minutes in face-to-face in clinical consultation, greater than 50% of which was spent on medical decision-making and counseling/coordinating care for PCN placement.     Murrell Redden  El-Abd, MD  Vascular and Interventional Radiology 03/27/2021 4:33 PM

## 2021-03-27 NOTE — Progress Notes (Signed)
Tricia Haggard, MD of Urology to clarify plan of care as diet orders were changed to NPO. Provider states pt may be going for surgery. Provider states he will see pt this morning and discuss plan of care.

## 2021-03-27 NOTE — MAU Note (Signed)
Pt transferred via wheelchair to [facility] room 103.

## 2021-03-27 NOTE — Progress Notes (Signed)
Witnessed [place], Rn waste 4mg  of Morphine in [place].

## 2021-03-27 NOTE — Consult Note (Addendum)
Consultation: Right hydronephrosis, fever, 10 mm right distal stone Requested by: Dr. Steva Ready  History of Present Illness: Tricia Mayo is a 20 year old female who is [redacted] weeks pregnant.  She has been passing a 10 mm right stone  first noted in the right proximal ureter on ultrasound 03/06/2021.  She was readmitted last night 03/26/2021 for pain control and follow-up ultrasound noted moderate right hydronephrosis, bilateral renal stones, no left hydronephrosis and the stone is now located in the right distal ureter.  She initially did well with controlled pain and tolerating a regular diet, but her pain has increased throughout the morning and she has become tachycardic and tepid throughout the day.  She spiked a temp to 100.8 and had a heart rate of 120.  She continues to have right flank pain and has chills.  She has not seen a stone pass.  Past Medical History:  Diagnosis Date   Kidney stones    Past Surgical History:  Procedure Laterality Date   NO PAST SURGERIES      Home Medications:  Medications Prior to Admission  Medication Sig Dispense Refill Last Dose   oxyCODONE-acetaminophen (PERCOCET/ROXICET) 5-325 MG tablet Take 1 tablet by mouth every 6 (six) hours as needed for severe pain. 20 tablet 0 03/26/2021 at 1400   prenatal vitamin w/FE, FA (PRENATAL 1 + 1) 27-1 MG TABS tablet Take 1 tablet by mouth daily at 12 noon.   03/25/2021   tamsulosin (FLOMAX) 0.4 MG CAPS capsule Take 1 capsule (0.4 mg total) by mouth daily. 30 capsule 0 03/26/2021 at 1400   terconazole (TERAZOL 7) 0.4 % vaginal cream Place 1 applicator vaginally at bedtime. 45 g 0 03/25/2021   Allergies: No Known Allergies  No family history on file. Social History:  reports that she has never smoked. She has never used smokeless tobacco. She reports that she does not currently use alcohol. She reports that she does not use drugs.  ROS: A complete review of systems was performed.  All systems are negative except for  pertinent findings as noted. Review of Systems  Constitutional:  Positive for chills and fever.  Genitourinary:  Positive for flank pain.    Physical Exam:  Vital signs in last 24 hours: Temp:  [98.1 F (36.7 C)-100.8 F (38.2 C)] 100.8 F (38.2 C) (10/30 1507) Pulse Rate:  [92-120] 120 (10/30 1507) Resp:  [17-18] 18 (10/30 1507) BP: (103-134)/(43-78) 131/64 (10/30 1507) SpO2:  [96 %-100 %] 96 % (10/30 1507) Weight:  [83.5 kg] 83.5 kg (10/30 0728) General:  Alert and oriented, No acute distress HEENT: Normocephalic, atraumatic Cardiovascular: Regular rate and rhythm Lungs: Regular rate and effort Abdomen: Soft, nontender, nondistended, no abdominal masses Back: mild right CVA tenderness Extremities: No edema Neurologic: Grossly intact  Laboratory Data:  Results for orders placed or performed during the hospital encounter of 03/26/21 (from the past 24 hour(s))  Urinalysis, Routine w reflex microscopic Urine, Clean Catch     Status: Abnormal   Collection Time: 03/26/21  6:33 PM  Result Value Ref Range   Color, Urine YELLOW YELLOW   APPearance HAZY (A) CLEAR   Specific Gravity, Urine 1.015 1.005 - 1.030   pH 6.0 5.0 - 8.0   Glucose, UA NEGATIVE NEGATIVE mg/dL   Hgb urine dipstick NEGATIVE NEGATIVE   Bilirubin Urine NEGATIVE NEGATIVE   Ketones, ur 80 (A) NEGATIVE mg/dL   Protein, ur NEGATIVE NEGATIVE mg/dL   Nitrite NEGATIVE NEGATIVE   Leukocytes,Ua TRACE (A) NEGATIVE   RBC / HPF  11-20 0 - 5 RBC/hpf   WBC, UA 21-50 0 - 5 WBC/hpf   Bacteria, UA RARE (A) NONE SEEN   Squamous Epithelial / LPF 6-10 0 - 5   Mucus PRESENT   CBC with Differential/Platelet     Status: Abnormal   Collection Time: 03/26/21  7:52 PM  Result Value Ref Range   WBC 13.8 (H) 4.0 - 10.5 K/uL   RBC 4.06 3.87 - 5.11 MIL/uL   Hemoglobin 12.5 12.0 - 15.0 g/dL   HCT 16.1 09.6 - 04.5 %   MCV 94.3 80.0 - 100.0 fL   MCH 30.8 26.0 - 34.0 pg   MCHC 32.6 30.0 - 36.0 g/dL   RDW 40.9 81.1 - 91.4 %    Platelets 244 150 - 400 K/uL   nRBC 0.0 0.0 - 0.2 %   Neutrophils Relative % 83 %   Neutro Abs 11.4 (H) 1.7 - 7.7 K/uL   Lymphocytes Relative 8 %   Lymphs Abs 1.1 0.7 - 4.0 K/uL   Monocytes Relative 8 %   Monocytes Absolute 1.1 (H) 0.1 - 1.0 K/uL   Eosinophils Relative 0 %   Eosinophils Absolute 0.0 0.0 - 0.5 K/uL   Basophils Relative 0 %   Basophils Absolute 0.0 0.0 - 0.1 K/uL   Immature Granulocytes 1 %   Abs Immature Granulocytes 0.10 (H) 0.00 - 0.07 K/uL  Comprehensive metabolic panel     Status: Abnormal   Collection Time: 03/26/21  7:52 PM  Result Value Ref Range   Sodium 135 135 - 145 mmol/L   Potassium 3.8 3.5 - 5.1 mmol/L   Chloride 103 98 - 111 mmol/L   CO2 22 22 - 32 mmol/L   Glucose, Bld 97 70 - 99 mg/dL   BUN <5 (L) 6 - 20 mg/dL   Creatinine, Ser 7.82 0.44 - 1.00 mg/dL   Calcium 9.1 8.9 - 95.6 mg/dL   Total Protein 6.5 6.5 - 8.1 g/dL   Albumin 3.0 (L) 3.5 - 5.0 g/dL   AST 22 15 - 41 U/L   ALT 14 0 - 44 U/L   Alkaline Phosphatase 103 38 - 126 U/L   Total Bilirubin 0.9 0.3 - 1.2 mg/dL   GFR, Estimated >21 >30 mL/min   Anion gap 10 5 - 15  Type and screen [facility]     Status: None   Collection Time: 03/26/21 11:33 PM  Result Value Ref Range   ABO/RH(D) B POS    Antibody Screen NEG    Sample Expiration      03/29/2021,2359 Performed at [facility], [address] [place], [place] [address]   Resp Panel by RT-PCR (Flu A&B, Covid) Nasopharyngeal Swab     Status: None   Collection Time: 03/27/21  5:15 AM   Specimen: Nasopharyngeal Swab; Nasopharyngeal(NP) swabs in vial transport medium  Result Value Ref Range   SARS Coronavirus 2 by RT PCR NEGATIVE NEGATIVE   Influenza A by PCR NEGATIVE NEGATIVE   Influenza B by PCR NEGATIVE NEGATIVE   Recent Results (from the past 240 hour(s))  Culture, OB Urine     Status: Abnormal   Collection Time: 03/20/21  6:23 PM   Specimen: Urine, Random  Result Value Ref Range Status   Specimen  Description URINE, RANDOM  Final   Special Requests NONE  Final   Culture (A)  Final    MULTIPLE SPECIES PRESENT, SUGGEST RECOLLECTION NO GROUP B STREP (S.AGALACTIAE) ISOLATED Performed at [facility], [address] [place], [place] [address]    Report Status 03/22/2021 FINAL  Final  Wet prep, genital     Status: Abnormal   Collection Time: 03/20/21  6:59 PM  Result Value Ref Range Status   Yeast Wet Prep HPF POC NONE SEEN NONE SEEN Final   Trich, Wet Prep NONE SEEN NONE SEEN Final   Clue Cells Wet Prep HPF POC NONE SEEN NONE SEEN Final   WBC, Wet Prep HPF POC MANY (A) NONE SEEN Final   Sperm NONE SEEN  Final    Comment: Performed at [facility], [address] [place], [place] [address]  Resp Panel by RT-PCR (Flu A&B, Covid) Nasopharyngeal Swab     Status: None   Collection Time: 03/27/21  5:15 AM   Specimen: Nasopharyngeal Swab; Nasopharyngeal(NP) swabs in vial transport medium  Result Value Ref Range Status   SARS Coronavirus 2 by RT PCR NEGATIVE NEGATIVE Final    Comment: (NOTE) SARS-CoV-2 target nucleic acids are NOT DETECTED.  The SARS-CoV-2 RNA is generally detectable in upper respiratory specimens during the acute phase of infection. The lowest concentration of SARS-CoV-2 viral copies this assay can detect is 138 copies/mL. A negative result does not preclude SARS-Cov-2 infection and should not be used as the sole basis for treatment or other patient management decisions. A negative result may occur with  improper specimen collection/handling, submission of specimen other than nasopharyngeal swab, presence of viral mutation(s) within the areas targeted by this assay, and inadequate number of viral copies(<138 copies/mL). A negative result must be combined with clinical observations, patient history, and epidemiological information. The expected result is Negative.  Fact Sheet for Patients:  BloggerCourse.com  Fact Sheet for  Healthcare Providers:  SeriousBroker.it  This test is no t yet approved or cleared by the [place] FDA and  has been authorized for detection and/or diagnosis of SARS-CoV-2 by FDA under an Emergency Use Authorization (EUA). This EUA will remain  in effect (meaning this test can be used) for the duration of the COVID-19 declaration under Section 564(b)(1) of the Act, 21 U.S.C.section 360bbb-3(b)(1), unless the authorization is terminated  or revoked sooner.       Influenza A by PCR NEGATIVE NEGATIVE Final   Influenza B by PCR NEGATIVE NEGATIVE Final    Comment: (NOTE) The Xpert Xpress SARS-CoV-2/FLU/RSV plus assay is intended as an aid in the diagnosis of influenza from Nasopharyngeal swab specimens and should not be used as a sole basis for treatment. Nasal washings and aspirates are unacceptable for Xpert Xpress SARS-CoV-2/FLU/RSV testing.  Fact Sheet for Patients: BloggerCourse.com  Fact Sheet for Healthcare Providers: SeriousBroker.it  This test is not yet approved or cleared by the [place] FDA and has been authorized for detection and/or diagnosis of SARS-CoV-2 by FDA under an Emergency Use Authorization (EUA). This EUA will remain in effect (meaning this test can be used) for the duration of the COVID-19 declaration under Section 564(b)(1) of the Act, 21 U.S.C. section 360bbb-3(b)(1), unless the authorization is terminated or revoked.  Performed at [facility], [address], [place], [place] [address]    Creatinine: Recent Labs    03/26/21 1952  CREATININE 0.65    Impression/Assessment/plan:  Right distal ureteral stone, right hydronephrosis and fever tachycardia and 33-week pregnant patient-I drew the patient, her mom and in-laws a picture of the anatomy.  We discussed the nature risks and benefits of continued stone passage, cystoscopy with right ureteral stent  placement or right percutaneous nephrostomy placement.  Given her fever, more definitive drainage, no need for general anesthesia on placement or exchange, easier exchange and potentially less pain we will proceed with a right nephrostomy tube.  I discussed the patient with Dr. Connye Burkitt and Dr. Juliette Alcide and appreciate their assistance.  We will plan to change the right nephrostomy tube around 11/30, she is due in the middle of December, and then we can plan for right ureteroscopy near the end of December.  Discussed with patient and her family. If the stone should pass we could remove the nephrostomy tube.  Jerilee Field 03/27/2021, 4:53 PM

## 2021-03-27 NOTE — Procedures (Signed)
Interventional Radiology Procedure Note  Date of Procedure: 03/27/2021  Procedure: Right PCN placement  Findings:  1. Right hydronephrosis  2. Successful placement of a 10 Fr right PCN to bag drainage   Complications: No immediate complications noted.   Estimated Blood Loss: minimal  Follow-up and Recommendations: 1. Patient is expected to deliver within the next 6-8 weeks, when we would normally plan for routine exchange. If she is able to have definitive treatment with Urology shortly after delivery within that timeframe, she may be able to avoid a tube exchange assuming continued good function of the PCN.    Olive Bass, MD  Vascular & Interventional Radiology  03/27/2021 6:09 PM

## 2021-03-27 NOTE — H&P (Signed)
HPI: 20 y.o. G1P0 @ [redacted]w[redacted]d estimated gestational age (as dated by LMP c/w early [place] and week ultrasound) who is admitted for pain management for distal right-sided ureteral obstructing renal stone. She has a history of nephrolithiasis seen on [place] on 10/9 on the right side. Reports right flank pain.  While in MAU, a renal US showed a distal right-sided ureteral obstructing renal stone measuring 1.1cm with moderate right-sided hydronephrosis. Bilateral subcentimeter nonobstructing renal calculi present. During hospital course, has become tachycardic to 120s and temperature of 99. Urology consulted (see note) and plans to perform stent today.  Leakage of fluid:  No Vaginal bleeding:  No Contractions:  No Fetal movement:  Yes  Prenatal care has been provided by Dr. [facility])  ROS:  Denies fevers, chills, chest pain, visual changes, SOB, RUQ/epigastric pain, N/V, dysuria, hematuria, or sudden onset/worsening bilateral LE or facial edema.  Pregnancy complicated by: Uncomplicated   Prenatal Transfer Tool  Maternal Diabetes: No Genetic Screening: Declined Maternal Ultrasounds/Referrals: Normal Fetal Ultrasounds or other Referrals:  None Maternal Substance Abuse:  No Significant Maternal Medications:  None Significant Maternal Lab Results: None   Prenatal Labs Blood type:  B Positive Antibody screen:  Negative CBC:  H/H 12/37 Rubella: Immune RPR:  Non-reactive Hep B:  Negative Hep C:  Negative HIV:  Negative GC/CT:  Negative Glucola:  119.8 (wnl)  Immunizations: Tdap: Has not had yet Flu: Has not had yet  OBHx:  OB History     Gravida  1   Para      Term      Preterm      AB      Living         SAB      IAB      Ectopic      Multiple      Live Births             PMHx:  None Meds:  PNV Allergy:  No Known Allergies SurgHx: None SocHx:   Denies Tobacco, ETOH, illicit drugs  O: BP (!) 103/48 (BP Location: Left Arm)   Pulse (!) 120   Temp  98.8 F (37.1 C) (Oral)   Resp 18   Ht 5\' 5"  (1.651 m)   Wt 83.5 kg   SpO2 96%   BMI 30.62 kg/m  Gen. AAOx3, uncomfortable appearing CV.  RRR  Resp. CTAB, no wheezes/rales/rhonchi Abd. Gravid, soft, non-tender throughout, no rebound/guarding Extr.  No bilateral LE edema, no calf tenderness bilaterally MSK: Right flank tenderness present  FHT: 135 baseline, moderate variability, + accels,  - decels Toco: irregular  Labs: see orders  A/P:  20 y.o. G1P0 @ [redacted]w[redacted]d who is admitted for right ureteral obstructing renal stone  - Admit to [facility] - Admit labs (CBC, T&S, COVID screen) - NSTs q shift - FWB:  Reassuring - Diet:  NPO - IVF:  Maintenance - VTE Prophylaxis:  SCDs - Pain control: Roxicodone 5mg  q4h PRN, Morphine 1-2mg  q4h PRN - Consult: Urology consulted, appreciate recommendations  - Antibiotics: Rocephin 2g ordered (per Urology) - Per Urology, after stent placement today, recommend discharge home with Keflex 500mg  BID x 5 days and then nightly until stent removal procedure outpatient in a few weeks  [facility], DO

## 2021-03-27 NOTE — Progress Notes (Signed)
4 mg Morphine removed from PYXIS, not given. Wasted in [place] with Jerrell Mylar, RN as witness.

## 2021-03-27 NOTE — Progress Notes (Signed)
Contacted by Mena Goes, MD whom states to change pt back to regular diet, to  contact Urology for continued pain, and continue to assess for passage of kidney stones. Will continue to monitor patient.

## 2021-03-28 LAB — CULTURE, OB URINE: Culture: >=100000 — AB

## 2021-03-28 MED ORDER — NIFEDIPINE 10 MG PO CAPS
10.0000 mg | ORAL_CAPSULE | Freq: Four times a day (QID) | ORAL | Status: DC | PRN
  Administered 2021-03-29 (×2): 10 mg via ORAL
  Filled 2021-03-28 (×2): qty 1

## 2021-03-28 MED ORDER — OXYCODONE HCL 5 MG PO TABS
5.0000 mg | ORAL_TABLET | ORAL | Status: DC | PRN
  Administered 2021-03-28: 10 mg via ORAL
  Administered 2021-03-28: 5 mg via ORAL
  Filled 2021-03-28: qty 2
  Filled 2021-03-28: qty 1

## 2021-03-28 MED ORDER — LACTATED RINGERS IV BOLUS
1000.0000 mL | Freq: Once | INTRAVENOUS | Status: AC
  Administered 2021-03-28: 1000 mL via INTRAVENOUS

## 2021-03-28 MED ORDER — MORPHINE SULFATE (PF) 2 MG/ML IV SOLN
1.0000 mg | INTRAVENOUS | Status: DC | PRN
  Administered 2021-03-28 (×3): 1 mg via INTRAVENOUS
  Filled 2021-03-28 (×3): qty 1

## 2021-03-28 MED ORDER — SODIUM CHLORIDE 0.9 % IV SOLN
1.0000 g | INTRAVENOUS | Status: DC
  Administered 2021-03-28 – 2021-03-29 (×2): 1 g via INTRAVENOUS
  Filled 2021-03-28 (×2): qty 10

## 2021-03-28 MED ORDER — NIFEDIPINE 10 MG PO CAPS
10.0000 mg | ORAL_CAPSULE | Freq: Once | ORAL | Status: AC
  Administered 2021-03-28: 10 mg via ORAL
  Filled 2021-03-28: qty 1

## 2021-03-28 NOTE — Progress Notes (Signed)
Chief Complaint: Patient was seen today for (R)PCN  Supervising Physician: Pernell Dupre  Patient Status: [facility] - In-pt  Subjective: S/p (R)PCN yesterday for distal ureteral stone. Doing okay, some soreness as expected. Mother at bedside. Reports bag was full of urine last pm  Objective: Physical Exam: BP 132/80 (BP Location: Left Arm)   Pulse 96   Temp 98.5 F (36.9 C) (Oral)   Resp 16   Ht 5\' 5"  (1.651 m)   Wt 83.5 kg   SpO2 100%   BMI 30.62 kg/m  (R)PCN intact, site clean, mildly tender Clear UOP in bag.   Current Facility-Administered Medications:    0.9 %  sodium chloride infusion, 250 mL, Intravenous, PRN, , DO   acetaminophen (TYLENOL) tablet 650 mg, 650 mg, Oral, Q4H PRN, Steva Ready, DO, 650 mg at 03/28/21 0049   calcium carbonate (TUMS - dosed in mg elemental calcium) chewable tablet 400 mg of elemental calcium, 2 tablet, Oral, Q4H PRN, 03/30/21, DO   cefTRIAXone (ROCEPHIN) 1 g in sodium chloride 0.9 % 100 mL IVPB, 1 g, Intravenous, Q24H, Steva Ready, MD   docusate sodium (COLACE) capsule 100 mg, 100 mg, Oral, Daily, Belva Agee, Melissa, DO, 100 mg at 03/27/21 0956   fluticasone (FLONASE) 50 MCG/ACT nasal spray 1 spray, 1 spray, Each Nare, BID, 03/29/21, DO, 1 spray at 03/28/21 0000   lactated ringers infusion, , Intravenous, Continuous, 03/30/21, MD, Last Rate: 125 mL/hr at 03/27/21 1254, New Bag at 03/27/21 1254   morphine 2 MG/ML injection 1 mg, 1 mg, Intravenous, Q1H PRN, 03/29/21, DO, 1 mg at 03/28/21 0807   ondansetron [facility]) injection 4 mg, 4 mg, Intravenous, Q8H PRN, [facility], DO, 4 mg at 03/28/21 03/30/21   oxyCODONE (Oxy IR/ROXICODONE) immediate release tablet 5 mg, 5 mg, Oral, Q4H PRN, 5397, DO, 5 mg at 03/28/21 0405   prenatal multivitamin tablet 1 tablet, 1 tablet, Oral, Q1200, 03/30/21, DO   sodium chloride flush (NS) 0.9 % injection 3 mL, 3 mL, Intravenous, Q12H, Steva Ready, DO, 3 mL at 03/27/21 2104   sodium chloride flush (NS) 0.9 % injection 3 mL, 3 mL, Intravenous, PRN, 2105, DO  Labs: CBC Recent Labs    03/26/21 1952  WBC 13.8*  HGB 12.5  HCT 38.3  PLT 244   BMET Recent Labs    03/26/21 1952  NA 135  K 3.8  CL 103  CO2 22  GLUCOSE 97  BUN <5*  CREATININE 0.65  CALCIUM 9.1   LFT Recent Labs    03/26/21 1952  PROT 6.5  ALBUMIN 3.0*  AST 22  ALT 14  ALKPHOS 103  BILITOT 0.9   PT/INR No results for input(s): LABPROT, INR in the last 72 hours.   Studies/Results: 03/28/21 RENAL  Result Date: 03/26/2021 CLINICAL DATA:  Right flank pain. EXAM: RENAL / URINARY TRACT ULTRASOUND COMPLETE COMPARISON:  None. FINDINGS: Right Kidney: Renal measurements: 12.4 cm x 6.5 cm x 7.2 cm = volume: 302.5 mL. Echogenicity within normal limits. A 4.8 mm nonobstructing renal stone is seen within the lower pole of the right kidney. Moderate severity right-sided hydronephrosis is noted. No mass is visualized. Left Kidney: Renal measurements: 11.4 cm x 6.4 cm x 5.4 cm = volume: 204.1 mL. Echogenicity within normal limits. Multiple shadowing echogenic renal calculi are seen within the left kidney. The largest measures approximately 8.6 mm no mass or hydronephrosis visualized. Bladder: Appears normal for degree of bladder  distention. The left ureteral jet is visualized. Other: A 1.1 cm x 0.6 cm x 0.8 cm obstructing renal calculus is visualized within the distal right ureter. IMPRESSION: 1. Distal right-sided ureteral obstructing renal stone, as described above. 2. Subsequent moderate severity right-sided hydronephrosis. 3. Bilateral subcentimeter nonobstructing renal calculi. Electronically Signed   By: Aram Candela M.D.   On: 03/26/2021 22:30   IR NEPHROSTOMY PLACEMENT RIGHT  Result Date: 03/28/2021 INDICATION: Obstructing right nephrolithiasis during pregnancy, hydronephrosis with fever EXAM: 1. Ultrasound-guided puncture of a right renal calyx  2. Right antegrade nephrostomy tube through percutaneous access 3. Placement of a right percutaneous nephrostomy tube using fluoroscopic guidance COMPARISON:  None. MEDICATIONS: Patient was on antibiotics prior to the procedure. No additional antibiotics administered. ANESTHESIA/SEDATION: Fentanyl 2 mcg IV; Versed 50 mg IV Moderate Sedation Time:  15 minutes The patient was continuously monitored during the procedure by the interventional radiology nurse under my direct supervision. CONTRAST:  10 mL-administered into the collecting system(s) FLUOROSCOPY TIME:  Fluoroscopy Time: 2 minutes 30 seconds with 3 exposures (38 mGy) COMPLICATIONS: None immediate. PROCEDURE: Informed written consent was obtained from the patient after a thorough discussion of the procedural risks, benefits and alternatives. All questions were addressed. Maximal Sterile Barrier Technique was utilized including caps, mask, sterile gowns, sterile gloves, sterile drape, hand hygiene and skin antiseptic. A timeout was performed prior to the initiation of the procedure. The patient was placed prone on the exam table. The right flank was prepped and draped in the standard sterile fashion. Ultrasound was used to evaluate the right kidney, which demonstrated moderate hydronephrosis. Skin entry site was marked, and local analgesia was obtained with 1% lidocaine. Using ultrasound guidance, an appropriate lower pole posterior calyx was punctured using a 21-gauge Chiba needle. Entry into the collecting system was confirmed with return of urine, and gentle injection of contrast material under fluoroscopy opacifying the proximal collecting system. An 018 wire was advanced through the needle into the renal pelvis and proximal ureter, followed by placement of a transition dilator. An antegrade nephrostogram was then performed, which demonstrated moderate hydronephrosis and hydroureter. Over an 035 Amplatz wire, the percutaneous tract was serially dilated, and  a 10.2 French nephrostomy tube was advanced with the loop formed in the renal pelvis. Appropriate positioning of the nephrostomy tube was confirmed with injection of contrast material. The nephrostomy tube was secured to skin using silk suture and a dressing. It was attached to bag drainage. The patient tolerated the procedure well without immediate complication. IMPRESSION: 1. Successful placement of a 10.2 French right percutaneous nephrostomy tube for treatment of obstructive nephrolithiasis and hydronephrosis during pregnancy. 2. The patient will be tentatively scheduled for routine tube exchange in approximately 8 weeks if definitive stone treatment and tube removal has not been performed by that time. Electronically Signed   By: Olive Bass M.D.   On: 03/28/2021 08:52    Assessment/Plan: S/p (R)PCN yesterday. Per Urology note recs exchange end of November. Generally would not exchange for 6-8 weeks but timing of expected delivery may interfere with routine planning.  Hopefully can avoid an additional procedure/exchange prior to definitive stone treatment but will follow along with urology.     LOS: 1 day   I spent a total of 15 minutes in face to face in clinical consultation, greater than 50% of which was counseling/coordinating care for (R)PCN  Brayton El PA-C 03/28/2021 9:30 AM

## 2021-03-28 NOTE — Progress Notes (Signed)
Pt called out for RN, stating she was feeling ctz. Placed pt on monitor and pt noted to be having ctx q3-4 min. Notified Dr. Richardson Dopp. Received verbal order to give pt 10 mg of procardia now and then give another 10 mg if no relief from ctx 30 min from first dose. Will give first dose of procardia and will continue to monitor.

## 2021-03-28 NOTE — Progress Notes (Addendum)
Pt s/p right percutaneous nephrostomy. Urine and blood culture pending. Fever last night consistent with pyelonephritis -   A/P - right distal stone, UTI, ~51mm right renal stone, 8 mm left renal stone s/p right percutaneous nephrostomy 03/27/2021:  -abx per primary team  - given a dose of rocephin for procedure yesterday and on rocephin with urine and blood cultures pending. -will plan to see patient in office in 2 weeks -nephrostomy change in about 4 weeks -stone treatment in about 8 weeks   Please message me directly with any questions or concerns or page GU on call on amion.

## 2021-03-28 NOTE — Progress Notes (Signed)
Tricia Mayo is a 20 y.o. female G1P0 HD #1 admitted with obstructing nephrolithiasis. She is POD #1 from Placement of Right percutaneous nephrostomy tube. She also has pyenolnephritis on Day 2 of Rocephin.    Subjective: pt reports pain at the site of the nephrostomy tube insertion rated as 7 out of 10. She reports irregular contractions. +FM no lof no vaginal bleeding.   Vitals:   03/28/21 0403 03/28/21 0736 03/28/21 1140 03/28/21 1638  BP: (!) 107/44 132/80 (!) 120/59 (!) 118/50  Pulse: 92 96 (!) 117 (!) 105  Resp: 16 16 16 16   Temp: 98.2 F (36.8 C) 98.5 F (36.9 C) 98.8 F (37.1 C) 98.9 F (37.2 C)  TempSrc: Oral Oral Oral Oral  SpO2: 97% 100% 93% 95%  Weight:      Height:        Physical Exam Constitutional:      General: She is not in acute distress. Pulmonary:     Effort: Pulmonary effort is normal.  Abdominal:     Palpations: Abdomen is soft.     Tenderness: There is no abdominal tenderness.     Comments: Gravid    Musculoskeletal:        General: No swelling.     Cervical back: Normal range of motion.  Skin:    General: Skin is warm and dry.  Neurological:     General: No focal deficit present.     Mental Status: She is alert and oriented to person, place, and time.  Psychiatric:        Mood and Affect: Mood normal.        Behavior: Behavior normal.    Results for orders placed or performed during the hospital encounter of 03/26/21 (from the past 48 hour(s))  Urinalysis, Routine w reflex microscopic Urine, Clean Catch     Status: Abnormal   Collection Time: 03/26/21  6:33 PM  Result Value Ref Range   Color, Urine YELLOW YELLOW   APPearance HAZY (A) CLEAR   Specific Gravity, Urine 1.015 1.005 - 1.030   pH 6.0 5.0 - 8.0   Glucose, UA NEGATIVE NEGATIVE mg/dL   Hgb urine dipstick NEGATIVE NEGATIVE   Bilirubin Urine NEGATIVE NEGATIVE   Ketones, ur 80 (A) NEGATIVE mg/dL   Protein, ur NEGATIVE NEGATIVE mg/dL   Nitrite NEGATIVE NEGATIVE   Leukocytes,Ua  TRACE (A) NEGATIVE   RBC / HPF 11-20 0 - 5 RBC/hpf   WBC, UA 21-50 0 - 5 WBC/hpf   Bacteria, UA RARE (A) NONE SEEN   Squamous Epithelial / LPF 6-10 0 - 5   Mucus PRESENT     Comment: Performed at [facility], [address] [place], [place]  OB Urine Culture     Status: Abnormal   Collection Time: 03/26/21  7:35 PM   Specimen: OB Clean Catch; Urine  Result Value Ref Range   Specimen Description OB CLEAN CATCH    Special Requests NONE    Culture (A)     >=100,000 COLONIES/mL VIRIDANS STREPTOCOCCUS Standardized susceptibility testing for this organism is not available. Performed at [facility], [address], [place], [place]    Report Status 03/28/2021 FINAL   CBC with Differential/Platelet     Status: Abnormal   Collection Time: 03/26/21  7:52 PM  Result Value Ref Range   WBC 13.8 (H) 4.0 - 10.5 K/uL   RBC 4.06 3.87 - 5.11 MIL/uL   Hemoglobin 12.5 12.0 - 15.0 g/dL  HCT 38.3 36.0 - 46.0 %   MCV 94.3 80.0 - 100.0 fL   MCH 30.8 26.0 - 34.0 pg   MCHC 32.6 30.0 - 36.0 g/dL   RDW 31.2 81.1 - 88.6 %   Platelets 244 150 - 400 K/uL   nRBC 0.0 0.0 - 0.2 %   Neutrophils Relative % 83 %   Neutro Abs 11.4 (H) 1.7 - 7.7 K/uL   Lymphocytes Relative 8 %   Lymphs Abs 1.1 0.7 - 4.0 K/uL   Monocytes Relative 8 %   Monocytes Absolute 1.1 (H) 0.1 - 1.0 K/uL   Eosinophils Relative 0 %   Eosinophils Absolute 0.0 0.0 - 0.5 K/uL   Basophils Relative 0 %   Basophils Absolute 0.0 0.0 - 0.1 K/uL   Immature Granulocytes 1 %   Abs Immature Granulocytes 0.10 (H) 0.00 - 0.07 K/uL    Comment: Performed at [facility], [address] [place], [place] [address]  Comprehensive metabolic panel     Status: Abnormal   Collection Time: 03/26/21  7:52 PM  Result Value Ref Range   Sodium 135 135 - 145 mmol/L   Potassium 3.8 3.5 - 5.1 mmol/L   Chloride 103 98 - 111 mmol/L   CO2 22 22 - 32 mmol/L   Glucose, Bld 97 70 - 99 mg/dL    Comment: Glucose reference range  applies only to samples taken after fasting for at least 8 hours.   BUN <5 (L) 6 - 20 mg/dL   Creatinine, Ser 6.68 0.44 - 1.00 mg/dL   Calcium 9.1 8.9 - 15.9 mg/dL   Total Protein 6.5 6.5 - 8.1 g/dL   Albumin 3.0 (L) 3.5 - 5.0 g/dL   AST 22 15 - 41 U/L   ALT 14 0 - 44 U/L   Alkaline Phosphatase 103 38 - 126 U/L   Total Bilirubin 0.9 0.3 - 1.2 mg/dL   GFR, Estimated >47 >07 mL/min    Comment: (NOTE) Calculated using the CKD-EPI Creatinine Equation (2021)    Anion gap 10 5 - 15    Comment: Performed at [facility], [address] [place], [place] [address]  Type and screen [facility]     Status: None   Collection Time: 03/26/21 11:33 PM  Result Value Ref Range   ABO/RH(D) B POS    Antibody Screen NEG    Sample Expiration      03/29/2021,2359 Performed at [facility], [address], [place], [place] [address]   Resp Panel by RT-PCR (Flu A&B, Covid) Nasopharyngeal Swab     Status: None   Collection Time: 03/27/21  5:15 AM   Specimen: Nasopharyngeal Swab; Nasopharyngeal(NP) swabs in vial transport medium  Result Value Ref Range   SARS Coronavirus 2 by RT PCR NEGATIVE NEGATIVE    Comment: (NOTE) SARS-CoV-2 target nucleic acids are NOT DETECTED.  The SARS-CoV-2 RNA is generally detectable in upper respiratory specimens during the acute phase of infection. The lowest concentration of SARS-CoV-2 viral copies this assay can detect is 138 copies/mL. A negative result does not preclude SARS-Cov-2 infection and should not be used as the sole basis for treatment or other patient management decisions. A negative result may occur with  improper specimen collection/handling, submission of specimen other than nasopharyngeal swab, presence of viral mutation(s) within the areas targeted by this assay, and inadequate number of viral copies(<138 copies/mL). A negative result must be combined with clinical observations, patient history, and  epidemiological information. The expected result is Negative.  Fact Sheet for Patients:  BloggerCourse.com  Fact Sheet for Healthcare Providers:  SeriousBroker.it  This test is no t yet approved or cleared by the [place] FDA and  has been authorized for detection and/or diagnosis of SARS-CoV-2 by FDA under an Emergency Use Authorization (EUA). This EUA will remain  in effect (meaning this test can be used) for the duration of the COVID-19 declaration under Section 564(b)(1) of the Act, 21 U.S.C.section 360bbb-3(b)(1), unless the authorization is terminated  or revoked sooner.       Influenza A by PCR NEGATIVE NEGATIVE   Influenza B by PCR NEGATIVE NEGATIVE    Comment: (NOTE) The Xpert Xpress SARS-CoV-2/FLU/RSV plus assay is intended as an aid in the diagnosis of influenza from Nasopharyngeal swab specimens and should not be used as a sole basis for treatment. Nasal washings and aspirates are unacceptable for Xpert Xpress SARS-CoV-2/FLU/RSV testing.  Fact Sheet for Patients: BloggerCourse.com  Fact Sheet for Healthcare Providers: SeriousBroker.it  This test is not yet approved or cleared by the [place] FDA and has been authorized for detection and/or diagnosis of SARS-CoV-2 by FDA under an Emergency Use Authorization (EUA). This EUA will remain in effect (meaning this test can be used) for the duration of the COVID-19 declaration under Section 564(b)(1) of the Act, 21 U.S.C. section 360bbb-3(b)(1), unless the authorization is terminated or revoked.  Performed at [facility], [address] [place], [place] [address]      A/P HD #1 Obstructing right nephrolithiasis/ pyleonephritis  Per urology -will plan to see patient in office in 2 weeks -nephrostomy change in about 4 weeks -stone treatment in about 8 weeks   - Continue Rocephin until pt is afebrile  for at least 24 hours and then d/c home on Keflex 500 bid for 7 days followed by daily for prophylaxis.  - will follow blood and urine cutlures   Pain control- prn oxycodone and morphine   - Encouraged ambulation  - Preterm contractions - Procardia 10 mg q 6 hrs as needed.  Fetal well being - Category  1 NST .Marland Kitchen  NST q shift.

## 2021-03-29 DIAGNOSIS — N12 Tubulo-interstitial nephritis, not specified as acute or chronic: Secondary | ICD-10-CM | POA: Diagnosis present

## 2021-03-29 LAB — URINE CULTURE: Culture: NO GROWTH

## 2021-03-29 MED ORDER — OXYCODONE HCL 5 MG PO TABS: 5.0000 mg | ORAL_TABLET | Freq: Four times a day (QID) | ORAL | 0 refills | Status: DC | PRN

## 2021-03-29 MED ORDER — SODIUM CHLORIDE 0.9 % IV SOLN
1.0000 g | Freq: Once | INTRAVENOUS | Status: AC
  Administered 2021-03-29: 1 g via INTRAVENOUS
  Filled 2021-03-29: qty 10

## 2021-03-29 MED ORDER — METOCLOPRAMIDE HCL 10 MG PO TABS
10.0000 mg | ORAL_TABLET | Freq: Three times a day (TID) | ORAL | Status: DC
  Administered 2021-03-29 – 2021-03-30 (×6): 10 mg via ORAL
  Filled 2021-03-29 (×6): qty 1

## 2021-03-29 MED ORDER — NIFEDIPINE 10 MG PO CAPS: 10.0000 mg | ORAL_CAPSULE | Freq: Four times a day (QID) | ORAL | 0 refills | Status: DC | PRN

## 2021-03-29 MED ORDER — METOCLOPRAMIDE HCL 10 MG PO TABS: 10.0000 mg | ORAL_TABLET | Freq: Three times a day (TID) | ORAL | 0 refills | Status: DC

## 2021-03-29 MED ORDER — CEPHALEXIN 500 MG PO CAPS: 500.0000 mg | ORAL_CAPSULE | Freq: Two times a day (BID) | ORAL | 0 refills | Status: DC

## 2021-03-29 MED ORDER — SODIUM CHLORIDE 0.9 % IV SOLN: 2.0000 g | INTRAVENOUS | Status: DC

## 2021-03-29 MED ORDER — BETAMETHASONE SOD PHOS & ACET 6 (3-3) MG/ML IJ SUSP
12.0000 mg | INTRAMUSCULAR | Status: AC
  Administered 2021-03-29 – 2021-03-30 (×2): 12 mg via INTRAMUSCULAR
  Filled 2021-03-29: qty 5

## 2021-03-29 MED ORDER — CEPHALEXIN 500 MG PO CAPS
500.0000 mg | ORAL_CAPSULE | Freq: Two times a day (BID) | ORAL | Status: DC
  Administered 2021-03-29 – 2021-03-30 (×2): 500 mg via ORAL
  Filled 2021-03-29 (×2): qty 1

## 2021-03-29 MED ORDER — ACETAMINOPHEN 325 MG PO TABS: 650.0000 mg | ORAL_TABLET | ORAL | Status: AC | PRN

## 2021-03-29 NOTE — Progress Notes (Signed)
2 Days Post-Op Subjective: Patient reports nausea today.  Urine culture from 1029 grew greater than 100,000 Streptococcus with repeat cx negative.   Fever has resolved.  Objective: Vital signs in last 24 hours: Temp:  [98.1 F (36.7 C)-98.9 F (37.2 C)] 98.6 F (37 C) (11/01 1339) Pulse Rate:  [98-115] 98 (11/01 1339) Resp:  [16-18] 16 (11/01 1339) BP: (108-121)/(50-65) 109/55 (11/01 1339) SpO2:  [95 %-98 %] 95 % (11/01 0804)  Intake/Output from previous day: 10/31 0701 - 11/01 0700 In: 3002 [I.V.:2000; IV Piggyback:1002] Out: 2470 [Urine:2450; Emesis/NG output:20] Intake/Output this shift: Total I/O In: -  Out: 1275 [Urine:1025; Other:250]  Physical Exam:  She looks well, sitting in the bed.  Talking with her mom. Right nephrostomy tube in place-urine clear  Lab Results: Recent Labs    03/26/21 1952  HGB 12.5  HCT 38.3   BMET Recent Labs    03/26/21 1952  NA 135  K 3.8  CL 103  CO2 22  GLUCOSE 97  BUN <5*  CREATININE 0.65  CALCIUM 9.1   No results for input(s): LABPT, INR in the last 72 hours. No results for input(s): LABURIN in the last 72 hours. Results for orders placed or performed during the hospital encounter of 03/26/21  OB Urine Culture     Status: Abnormal   Collection Time: 03/26/21  7:35 PM   Specimen: OB Clean Catch; Urine  Result Value Ref Range Status   Specimen Description OB CLEAN CATCH  Final   Special Requests NONE  Final   Culture (A)  Final    >=100,000 COLONIES/mL VIRIDANS STREPTOCOCCUS Standardized susceptibility testing for this organism is not available. Performed at [facility], [address] [place], [place] [address]    Report Status 03/28/2021 FINAL  Final  Resp Panel by RT-PCR (Flu A&B, Covid) Nasopharyngeal Swab     Status: None   Collection Time: 03/27/21  5:15 AM   Specimen: Nasopharyngeal Swab; Nasopharyngeal(NP) swabs in vial transport medium  Result Value Ref Range Status   SARS Coronavirus 2 by RT PCR  NEGATIVE NEGATIVE Final    Comment: (NOTE) SARS-CoV-2 target nucleic acids are NOT DETECTED.  The SARS-CoV-2 RNA is generally detectable in upper respiratory specimens during the acute phase of infection. The lowest concentration of SARS-CoV-2 viral copies this assay can detect is 138 copies/mL. A negative result does not preclude SARS-Cov-2 infection and should not be used as the sole basis for treatment or other patient management decisions. A negative result may occur with  improper specimen collection/handling, submission of specimen other than nasopharyngeal swab, presence of viral mutation(s) within the areas targeted by this assay, and inadequate number of viral copies(<138 copies/mL). A negative result must be combined with clinical observations, patient history, and epidemiological information. The expected result is Negative.  Fact Sheet for Patients:  BloggerCourse.com  Fact Sheet for Healthcare Providers:  SeriousBroker.it  This test is no t yet approved or cleared by the [place] FDA and  has been authorized for detection and/or diagnosis of SARS-CoV-2 by FDA under an Emergency Use Authorization (EUA). This EUA will remain  in effect (meaning this test can be used) for the duration of the COVID-19 declaration under Section 564(b)(1) of the Act, 21 U.S.C.section 360bbb-3(b)(1), unless the authorization is terminated  or revoked sooner.       Influenza A by PCR NEGATIVE NEGATIVE Final   Influenza B by PCR NEGATIVE NEGATIVE Final    Comment: (NOTE) The Xpert Xpress SARS-CoV-2/FLU/RSV plus  assay is intended as an aid in the diagnosis of influenza from Nasopharyngeal swab specimens and should not be used as a sole basis for treatment. Nasal washings and aspirates are unacceptable for Xpert Xpress SARS-CoV-2/FLU/RSV testing.  Fact Sheet for Patients: BloggerCourse.com  Fact Sheet for  Healthcare Providers: SeriousBroker.it  This test is not yet approved or cleared by the [place] FDA and has been authorized for detection and/or diagnosis of SARS-CoV-2 by FDA under an Emergency Use Authorization (EUA). This EUA will remain in effect (meaning this test can be used) for the duration of the COVID-19 declaration under Section 564(b)(1) of the Act, 21 U.S.C. section 360bbb-3(b)(1), unless the authorization is terminated or revoked.  Performed at [facility], [address], [place], [place] [address]   Urine Culture     Status: None   Collection Time: 03/28/21  1:25 AM   Specimen: Urine, Clean Catch  Result Value Ref Range Status   Specimen Description URINE, CLEAN CATCH  Final   Special Requests NONE  Final   Culture   Final    NO GROWTH Performed at [facility], [address], [place], [place] [address]    Report Status 03/29/2021 FINAL  Final  Culture, blood (routine x 2)     Status: None (Preliminary result)   Collection Time: 03/28/21  1:25 AM   Specimen: BLOOD  Result Value Ref Range Status   Specimen Description BLOOD RIGHT ANTECUBITAL  Final   Special Requests   Final    BOTTLES DRAWN AEROBIC AND ANAEROBIC Blood Culture adequate volume   Culture   Final    NO GROWTH 1 DAY Performed at [facility], [address] [place], [place] [address]    Report Status PENDING  Incomplete  Culture, blood (routine x 2)     Status: None (Preliminary result)   Collection Time: 03/28/21  1:25 AM   Specimen: BLOOD  Result Value Ref Range Status   Specimen Description BLOOD LEFT ANTECUBITAL  Final   Special Requests   Final    BOTTLES DRAWN AEROBIC AND ANAEROBIC Blood Culture adequate volume   Culture   Final    NO GROWTH 1 DAY Performed at [facility], [address], [place], [place] [address]    Report Status PENDING  Incomplete    Studies/Results: IR NEPHROSTOMY PLACEMENT RIGHT  Result Date:  03/28/2021 INDICATION: Obstructing right nephrolithiasis during pregnancy, hydronephrosis with fever EXAM: 1. Ultrasound-guided puncture of a right renal calyx 2. Right antegrade nephrostomy tube through percutaneous access 3. Placement of a right percutaneous nephrostomy tube using fluoroscopic guidance COMPARISON:  None. MEDICATIONS: Patient was on antibiotics prior to the procedure. No additional antibiotics administered. ANESTHESIA/SEDATION: Fentanyl 2 mcg IV; Versed 50 mg IV Moderate Sedation Time:  15 minutes The patient was continuously monitored during the procedure by the interventional radiology nurse under my direct supervision. CONTRAST:  10 mL-administered into the collecting system(s) FLUOROSCOPY TIME:  Fluoroscopy Time: 2 minutes 30 seconds with 3 exposures (38 mGy) COMPLICATIONS: None immediate. PROCEDURE: Informed written consent was obtained from the patient after a thorough discussion of the procedural risks, benefits and alternatives. All questions were addressed. Maximal Sterile Barrier Technique was utilized including caps, mask, sterile gowns, sterile gloves, sterile drape, hand hygiene and skin antiseptic. A timeout was performed prior to the initiation of the procedure. The patient was placed prone on the exam table. The right flank was prepped and draped in the standard sterile fashion. Ultrasound  was used to evaluate the right kidney, which demonstrated moderate hydronephrosis. Skin entry site was marked, and local analgesia was obtained with 1% lidocaine. Using ultrasound guidance, an appropriate lower pole posterior calyx was punctured using a 21-gauge Chiba needle. Entry into the collecting system was confirmed with return of urine, and gentle injection of contrast material under fluoroscopy opacifying the proximal collecting system. An 018 wire was advanced through the needle into the renal pelvis and proximal ureter, followed by placement of a transition dilator. An antegrade  nephrostogram was then performed, which demonstrated moderate hydronephrosis and hydroureter. Over an 035 Amplatz wire, the percutaneous tract was serially dilated, and a 10.2 French nephrostomy tube was advanced with the loop formed in the renal pelvis. Appropriate positioning of the nephrostomy tube was confirmed with injection of contrast material. The nephrostomy tube was secured to skin using silk suture and a dressing. It was attached to bag drainage. The patient tolerated the procedure well without immediate complication. IMPRESSION: 1. Successful placement of a 10.2 French right percutaneous nephrostomy tube for treatment of obstructive nephrolithiasis and hydronephrosis during pregnancy. 2. The patient will be tentatively scheduled for routine tube exchange in approximately 8 weeks if definitive stone treatment and tube removal has not been performed by that time. Electronically Signed   By: Olive Bass M.D.   On: 03/28/2021 08:52    Assessment/Plan:  Right renal stones, right ureteral stone, UTI-status post right nephrostomy tube 03/27/2021. -We will plan to see back in office in 2 weeks to check her urine -Nightly prophylactic antibiotic not as necessary with a nephrostomy tube as it would have been with a stent. -Plan nephrostomy tube change in about a month and right ureteroscopy in about 2 months (or soon after delivery).  Appreciate excellent care by primary team.  Please message me or page GU on-call with any questions, concerns or changes in patient's status.   LOS: 2 days   Jerilee Field 03/29/2021, 3:42 PM

## 2021-03-29 NOTE — Progress Notes (Signed)
Tricia Mayo is a 20 y.o. female G1P0 at 17 weeks and 3 days HD #2 admitted with obstructing nephrolithiasis. She is POD #2 from Placement of Right percutaneous nephrostomy tube. She also has pyenolnephritis on Day 3 of Rocephin.  Subjective: pt states pain is improved from yesterday rated as 5 out 10 currently. She reports + FM no contractions currently. She had contraction this morning at 3 am resolved with iv fluid bolus and procardia 10 mg. She reports 2 episodes of emesis yesterday. She states when she eats the food feels like it " just sits in her stomach" and then she has episodes of emesis.   Vitals:   03/29/21 0447 03/29/21 0804  BP: (!) 108/50 (!) 114/57  Pulse: (!) 115 98  Resp: 18 18  Temp: 98.1 F (36.7 C) 98.4 F (36.9 C)  SpO2: 95% 95%   Physical Exam Constitutional:      General: She is not in acute distress. Pulmonary:     Effort: Pulmonary effort is normal.  Abdominal:     Palpations: Abdomen is soft.     Tenderness: There is no abdominal tenderness.     Comments: Gravid    Musculoskeletal:        General: No swelling.     Cervical back: Normal range of motion.  Skin:    General: Skin is warm and dry.  Neurological:     General: No focal deficit present.     Mental Status: She is alert and oriented to person, place, and time.  Psychiatric:        Mood and Affect: Mood normal.        Behavior: Behavior normal  A/P HD #2 Obstructing right nephrolithiasis/ pyleonephritis  Per urology -will plan to see patient in office in 2 weeks -nephrostomy change in about 4 weeks -stone treatment in about 8 weeks    - Pyelo- pt has been afebrile for over 24 hours will switch to kefles 500 mg bid for 7 days and then qd   - GI Reglan add for reported emesis- encouraged pt  to increase po intake   -Pain control- prn oxycodone and morphine    - Encouraged ambulation  - Preterm contractions - Procardia 10 mg q 6 hrs as needed.  Fetal well being - Category  1 NST .Marland Kitchen   NST q shift.   Possible discharge this evening or tomorrow if pt  is able to tolerate po

## 2021-03-30 ENCOUNTER — Other Ambulatory Visit (HOSPITAL_COMMUNITY): Payer: Self-pay

## 2021-03-30 MED ORDER — CEPHALEXIN 500 MG PO CAPS
500.0000 mg | ORAL_CAPSULE | Freq: Four times a day (QID) | ORAL | Status: DC
  Administered 2021-03-30: 500 mg via ORAL
  Filled 2021-03-30: qty 1

## 2021-03-30 MED ORDER — CEPHALEXIN 500 MG PO CAPS
500.0000 mg | ORAL_CAPSULE | Freq: Four times a day (QID) | ORAL | 0 refills | Status: AC
  Filled 2021-03-30: qty 28, 7d supply, fill #0

## 2021-03-30 NOTE — Plan of Care (Signed)

## 2021-03-30 NOTE — Discharge Summary (Signed)
Physician Discharge Summary  Patient ID: Tricia Mayo MRN: [id number] DOB/AGE: 2001-04-25 20 y.o.  Admit date: 03/26/2021 Discharge date: 03/30/2021  Admission Diagnoses: Third trimester pregnancy Nephrolithiasis Pyelonephritis  Discharge Diagnoses:  Active Problems:   Nephrolithiasis   Pyelonephritis Third trimester pregnancy  Discharged Condition: good  Hospital Course: Patient is a G1P0 admitted on 03/26/2021 at [redacted]w[redacted]d for right 1.1cm nephrolithiasis, right flank pain, and moderate right-sided hydronephrosis. She was also noted to have several non-obstructing renal stones on the left. She developed Pyelonephritis during this admission with symptoms of fever and tachycardia. Urology was consulted and given her symptoms, they consulted interventional radiology for percutaneous nephrostomy tube placement which was placed on 03/27/2021. She was given PO and IV narcotic medications for pain control. She was placed on IV Rocephin during her admission and transitioned to Keflex 500mg  BID x 7 days and discharged home with Keflex 500mg  nightly suppression. Prior to discharge home, her pain was well-controlled and she remained afebrile for more than 24 hours. She was noted to have signs/symptoms of preterm labor this admission. Her cerivx changed from 0.5cm to 1.5cm. She was given two doses of betamethasone, last done was given on 03/30/2021. Otherwise, she has had reassuring fetal heart rate status on fetal monitoring.She has outpatient follow-up scheduled with Urology.  Consults: urology and Interventional Radiology  Significant Diagnostic Studies: labs:  Results for orders placed or performed during the hospital encounter of 03/26/21  OB Urine Culture   Specimen: OB Clean Catch; Urine  Result Value Ref Range   Specimen Description OB CLEAN CATCH    Special Requests NONE    Culture (A)     >=100,000 COLONIES/mL VIRIDANS STREPTOCOCCUS Standardized susceptibility testing for this organism  is not available. Performed at [facility], [address], [place], [address] [place]    Report Status 03/28/2021 FINAL   Resp Panel by RT-PCR (Flu A&B, Covid) Nasopharyngeal Swab   Specimen: Nasopharyngeal Swab; Nasopharyngeal(NP) swabs in vial transport medium  Result Value Ref Range   SARS Coronavirus 2 by RT PCR NEGATIVE NEGATIVE   Influenza A by PCR NEGATIVE NEGATIVE   Influenza B by PCR NEGATIVE NEGATIVE  Urine Culture   Specimen: Urine, Clean Catch  Result Value Ref Range   Specimen Description URINE, CLEAN CATCH    Special Requests NONE    Culture      NO GROWTH Performed at [facility], [address] [place], [address] [place]    Report Status 03/29/2021 FINAL   Culture, blood (routine x 2)   Specimen: BLOOD  Result Value Ref Range   Specimen Description BLOOD RIGHT ANTECUBITAL    Special Requests      BOTTLES DRAWN AEROBIC AND ANAEROBIC Blood Culture adequate volume   Culture      NO GROWTH 2 DAYS Performed at [facility], [address] [place], [address] [place]    Report Status PENDING   Culture, blood (routine x 2)   Specimen: BLOOD  Result Value Ref Range   Specimen Description BLOOD LEFT ANTECUBITAL    Special Requests      BOTTLES DRAWN AEROBIC AND ANAEROBIC Blood Culture adequate volume   Culture      NO GROWTH 2 DAYS Performed at [facility], [address], [place], [address] [place]    Report Status PENDING   Urinalysis, Routine w reflex microscopic Urine, Clean Catch  Result Value Ref Range   Color, Urine YELLOW YELLOW   APPearance HAZY (A) CLEAR  Specific Gravity, Urine 1.015 1.005 - 1.030   pH 6.0 5.0 - 8.0   Glucose, UA NEGATIVE NEGATIVE mg/dL   Hgb urine dipstick NEGATIVE NEGATIVE   Bilirubin Urine NEGATIVE NEGATIVE   Ketones, ur 80 (A) NEGATIVE mg/dL   Protein, ur NEGATIVE NEGATIVE mg/dL   Nitrite NEGATIVE NEGATIVE   Leukocytes,Ua TRACE (A) NEGATIVE   RBC / HPF 11-20 0 - 5 RBC/hpf   WBC, UA 21-50 0 - 5  WBC/hpf   Bacteria, UA RARE (A) NONE SEEN   Squamous Epithelial / LPF 6-10 0 - 5   Mucus PRESENT   CBC with Differential/Platelet  Result Value Ref Range   WBC 13.8 (H) 4.0 - 10.5 K/uL   RBC 4.06 3.87 - 5.11 MIL/uL   Hemoglobin 12.5 12.0 - 15.0 g/dL   HCT 71.6 96.7 - 89.3 %   MCV 94.3 80.0 - 100.0 fL   MCH 30.8 26.0 - 34.0 pg   MCHC 32.6 30.0 - 36.0 g/dL   RDW 81.0 17.5 - 10.2 %   Platelets 244 150 - 400 K/uL   nRBC 0.0 0.0 - 0.2 %   Neutrophils Relative % 83 %   Neutro Abs 11.4 (H) 1.7 - 7.7 K/uL   Lymphocytes Relative 8 %   Lymphs Abs 1.1 0.7 - 4.0 K/uL   Monocytes Relative 8 %   Monocytes Absolute 1.1 (H) 0.1 - 1.0 K/uL   Eosinophils Relative 0 %   Eosinophils Absolute 0.0 0.0 - 0.5 K/uL   Basophils Relative 0 %   Basophils Absolute 0.0 0.0 - 0.1 K/uL   Immature Granulocytes 1 %   Abs Immature Granulocytes 0.10 (H) 0.00 - 0.07 K/uL  Comprehensive metabolic panel  Result Value Ref Range   Sodium 135 135 - 145 mmol/L   Potassium 3.8 3.5 - 5.1 mmol/L   Chloride 103 98 - 111 mmol/L   CO2 22 22 - 32 mmol/L   Glucose, Bld 97 70 - 99 mg/dL   BUN <5 (L) 6 - 20 mg/dL   Creatinine, Ser 5.85 0.44 - 1.00 mg/dL   Calcium 9.1 8.9 - 27.7 mg/dL   Total Protein 6.5 6.5 - 8.1 g/dL   Albumin 3.0 (L) 3.5 - 5.0 g/dL   AST 22 15 - 41 U/L   ALT 14 0 - 44 U/L   Alkaline Phosphatase 103 38 - 126 U/L   Total Bilirubin 0.9 0.3 - 1.2 mg/dL   GFR, Estimated >82 >42 mL/min   Anion gap 10 5 - 15  Type and screen [facility]  Result Value Ref Range   ABO/RH(D) B POS    Antibody Screen NEG    Sample Expiration      03/29/2021,2359 Performed at [facility], [address], [place], [place] [address]    Renal US (03/26/21): CLINICAL DATA:  Right flank pain.   EXAM: RENAL / URINARY TRACT ULTRASOUND COMPLETE   COMPARISON:  None.   FINDINGS: Right Kidney:   Renal measurements: 12.4 cm x 6.5 cm x 7.2 cm = volume: 302.5 mL. Echogenicity within normal limits. A  4.8 mm nonobstructing renal stone is seen within the lower pole of the right kidney. Moderate severity right-sided hydronephrosis is noted. No mass is visualized.   Left Kidney:   Renal measurements: 11.4 cm x 6.4 cm x 5.4 cm = volume: 204.1 mL. Echogenicity within normal limits. Multiple shadowing echogenic renal calculi are seen within the left kidney. The largest measures approximately 8.6 mm no mass  or hydronephrosis visualized.   Bladder:   Appears normal for degree of bladder distention. The left ureteral jet is visualized.   Other:   A 1.1 cm x 0.6 cm x 0.8 cm obstructing renal calculus is visualized within the distal right ureter.   IMPRESSION: 1. Distal right-sided ureteral obstructing renal stone, as described above. 2. Subsequent moderate severity right-sided hydronephrosis. 3. Bilateral subcentimeter nonobstructing renal calculi.     Electronically Signed   By: Aram Candela M.D.   On: 03/26/2021 22:30  Treatments: IV hydration, antibiotics: ceftriaxone, and procedures: Percutaneous nephrostomy tube placement on the right  Discharge Exam: Blood pressure (!) 107/46, pulse 82, temperature 98.1 F (36.7 C), temperature source Oral, resp. rate 18, height 5\' 5"  (1.651 m), weight 83.5 kg, SpO2 96 %. Gen:  NAD, pleasant and cooperative Cardio:  RRR Pulm:  CTAB, no wheezes/rales/rhonchi Abd:  Soft, non-distended, non-tender throughout, no rebound/guarding Ext:  No bilateral LE edema, no bilateral calf tenderness, SCDs on an working  Disposition: Discharge disposition: 01-Home or Self Care       Discharge Instructions     Discharge activity:  No Restrictions   Complete by: As directed    Discharge diet:  No restrictions   Complete by: As directed    Do not have sex or do anything that might make you have an orgasm   Complete by: As directed    Notify physician for a general feeling that "something is not right"   Complete by: As directed    Notify  physician for increase or change in vaginal discharge   Complete by: As directed    Notify physician for intestinal cramps, with or without diarrhea, sometimes described as "gas pain"   Complete by: As directed    Notify physician for leaking of fluid   Complete by: As directed    Notify physician for low, dull backache, unrelieved by heat or Tylenol   Complete by: As directed    Notify physician for menstrual like cramps   Complete by: As directed    Notify physician for pelvic pressure   Complete by: As directed    Notify physician for uterine contractions.  These may be painless and feel like the uterus is tightening or the baby is  "balling up"   Complete by: As directed    Notify physician for vaginal bleeding   Complete by: As directed    PRETERM LABOR:  Includes any of the follwing symptoms that occur between 20 - [redacted] weeks gestation.  If these symptoms are not stopped, preterm labor can result in preterm delivery, placing your baby at risk   Complete by: As directed       Allergies as of 03/30/2021   No Known Allergies      Medication List     STOP taking these medications    oxyCODONE-acetaminophen 5-325 MG tablet Commonly known as: PERCOCET/ROXICET       TAKE these medications    acetaminophen 325 MG tablet Commonly known as: TYLENOL Take 2 tablets (650 mg total) by mouth every 4 (four) hours as needed for mild pain or moderate pain (for pain scale < 4  OR  temperature  >/=  100.5 F).   cephALEXin 500 MG capsule Commonly known as: KEFLEX Take 1 capsule (500 mg total) by mouth every 12 (twelve) hours.   metoCLOPramide 10 MG tablet Commonly known as: REGLAN Take 1 tablet (10 mg total) by mouth 3 (three) times daily before meals.   NIFEdipine  10 MG capsule Commonly known as: PROCARDIA Take 1 capsule (10 mg total) by mouth every 6 (six) hours as needed (regular ctx).   oxyCODONE 5 MG immediate release tablet Commonly known as: Oxy IR/ROXICODONE Take 1-2  tablets (5-10 mg total) by mouth every 6 (six) hours as needed for severe pain or moderate pain.   prenatal vitamin w/FE, FA 27-1 MG Tabs tablet Take 1 tablet by mouth daily at 12 noon.   tamsulosin 0.4 MG Caps capsule Commonly known as: Flomax Take 1 capsule (0.4 mg total) by mouth daily.   terconazole 0.4 % vaginal cream Commonly known as: TERAZOL 7 Place 1 applicator vaginally at bedtime.        Follow-up Information     Jerilee Field, MD. Call.   Specialty: Urology Contact information: [address] [place] [address] [phone]         Gerald Leitz, MD Follow up in 1 week(s).   Specialty: Obstetrics and Gynecology Why: please schedule an appointment with Dr. Richardson Dopp in 1 week Contact information: [address] [place] [address] [phone]                 Signed: Steva Ready 03/30/2021, 8:14 AM

## 2021-04-02 LAB — CULTURE, BLOOD (ROUTINE X 2)
Culture: NO GROWTH
Culture: NO GROWTH
Special Requests: ADEQUATE
Special Requests: ADEQUATE

## 2021-04-07 ENCOUNTER — Other Ambulatory Visit (HOSPITAL_COMMUNITY): Payer: Self-pay | Admitting: Interventional Radiology

## 2021-04-07 ENCOUNTER — Ambulatory Visit (HOSPITAL_COMMUNITY)
Admission: RE | Admit: 2021-04-07 | Discharge: 2021-04-07 | Disposition: A | Discharge status: Home / Self Care | Source: Ambulatory Visit | Attending: Interventional Radiology | Admitting: Interventional Radiology

## 2021-04-07 ENCOUNTER — Other Ambulatory Visit: Payer: Self-pay

## 2021-04-07 ENCOUNTER — Other Ambulatory Visit (HOSPITAL_COMMUNITY): Payer: Self-pay

## 2021-04-07 DIAGNOSIS — N1339 Other hydronephrosis: Secondary | ICD-10-CM

## 2021-04-07 DIAGNOSIS — Z436 Encounter for attention to other artificial openings of urinary tract: Secondary | ICD-10-CM | POA: Insufficient documentation

## 2021-04-07 HISTORY — PX: IR NEPHROSTOMY EXCHANGE RIGHT: IMG6070

## 2021-04-07 HISTORY — PX: IR CATHETER TUBE CHANGE: IMG717

## 2021-04-07 MED ORDER — LIDOCAINE HCL 1 % IJ SOLN
INTRAMUSCULAR | Status: AC
  Administered 2021-04-07: 20 mL
  Filled 2021-04-07: qty 20

## 2021-04-07 MED ORDER — NORMAL SALINE FLUSH 0.9 % IV SOLN
5.0000 mL | Freq: Two times a day (BID) | INTRAVENOUS | 2 refills | Status: DC
  Filled 2021-04-07: qty 600, 30d supply, fill #0
  Filled 2021-04-07: qty 600, 60d supply, fill #0

## 2021-04-07 MED ORDER — LIDOCAINE HCL (PF) 1 % IJ SOLN
INTRAMUSCULAR | Status: DC | PRN
  Administered 2021-04-07: 5 mL

## 2021-04-07 MED ORDER — IOHEXOL 300 MG/ML  SOLN
100.0000 mL | Freq: Once | INTRAMUSCULAR | Status: AC | PRN
  Administered 2021-04-07: 20 mL

## 2021-04-07 NOTE — Procedures (Signed)
Interventional Radiology Procedure Note  Procedure: Right nephrostomy drain upsize  Indication: Occluded right nephrostomy drain  Findings: Please refer to procedural dictation for full description.  Complications: None  EBL: < 10 mL  Tricia Belling, MD [phone]

## 2021-04-08 ENCOUNTER — Encounter (HOSPITAL_COMMUNITY): Payer: Self-pay

## 2021-04-08 ENCOUNTER — Other Ambulatory Visit (HOSPITAL_COMMUNITY): Payer: Self-pay | Admitting: Interventional Radiology

## 2021-04-08 DIAGNOSIS — N1339 Other hydronephrosis: Secondary | ICD-10-CM

## 2021-04-19 ENCOUNTER — Ambulatory Visit (HOSPITAL_COMMUNITY): Admission: RE | Admit: 2021-04-19 | Source: Ambulatory Visit

## 2021-04-20 ENCOUNTER — Other Ambulatory Visit (HOSPITAL_COMMUNITY)

## 2021-05-12 ENCOUNTER — Inpatient Hospital Stay (HOSPITAL_COMMUNITY)
Admission: AD | Admit: 2021-05-12 | Discharge: 2021-05-14 | DRG: 807 | Disposition: A | Discharge status: Home / Self Care | Attending: Obstetrics and Gynecology | Admitting: Obstetrics and Gynecology

## 2021-05-12 ENCOUNTER — Other Ambulatory Visit: Payer: Self-pay

## 2021-05-12 ENCOUNTER — Inpatient Hospital Stay (HOSPITAL_COMMUNITY)

## 2021-05-12 ENCOUNTER — Inpatient Hospital Stay (HOSPITAL_COMMUNITY): Admitting: Anesthesiology

## 2021-05-12 ENCOUNTER — Encounter (HOSPITAL_COMMUNITY): Payer: Self-pay | Admitting: Obstetrics and Gynecology

## 2021-05-12 DIAGNOSIS — Z3A39 39 weeks gestation of pregnancy: Secondary | ICD-10-CM

## 2021-05-12 DIAGNOSIS — Z3490 Encounter for supervision of normal pregnancy, unspecified, unspecified trimester: Secondary | ICD-10-CM

## 2021-05-12 DIAGNOSIS — Z20822 Contact with and (suspected) exposure to covid-19: Secondary | ICD-10-CM | POA: Diagnosis present

## 2021-05-12 DIAGNOSIS — Z87442 Personal history of urinary calculi: Secondary | ICD-10-CM

## 2021-05-12 DIAGNOSIS — O26893 Other specified pregnancy related conditions, third trimester: Secondary | ICD-10-CM | POA: Diagnosis present

## 2021-05-12 LAB — TYPE AND SCREEN
ABO/RH(D): B POS
Antibody Screen: NEGATIVE

## 2021-05-12 LAB — CBC
HCT: 37.6 % (ref 36.0–46.0)
Hemoglobin: 12 g/dL (ref 12.0–15.0)
MCH: 29.1 pg (ref 26.0–34.0)
MCHC: 31.9 g/dL (ref 30.0–36.0)
MCV: 91.3 fL (ref 80.0–100.0)
Platelets: 274 10*3/uL (ref 150–400)
RBC: 4.12 MIL/uL (ref 3.87–5.11)
RDW: 12 % (ref 11.5–15.5)
WBC: 8.1 10*3/uL (ref 4.0–10.5)
nRBC: 0 % (ref 0.0–0.2)

## 2021-05-12 LAB — RESP PANEL BY RT-PCR (FLU A&B, COVID) ARPGX2
Influenza A by PCR: NEGATIVE
Influenza B by PCR: NEGATIVE
SARS Coronavirus 2 by RT PCR: NEGATIVE

## 2021-05-12 LAB — OB RESULTS CONSOLE GBS: GBS: NEGATIVE

## 2021-05-12 MED ORDER — TERBUTALINE SULFATE 1 MG/ML IJ SOLN: 0.2500 mg | Freq: Once | INTRAMUSCULAR | Status: DC | PRN

## 2021-05-12 MED ORDER — LACTATED RINGERS IV SOLN
500.0000 mL | Freq: Once | INTRAVENOUS | Status: AC
  Administered 2021-05-12: 500 mL via INTRAVENOUS

## 2021-05-12 MED ORDER — LACTATED RINGERS IV SOLN: 500.0000 mL | INTRAVENOUS | Status: DC | PRN

## 2021-05-12 MED ORDER — LACTATED RINGERS IV SOLN: INTRAVENOUS | Status: DC

## 2021-05-12 MED ORDER — OXYTOCIN-SODIUM CHLORIDE 30-0.9 UT/500ML-% IV SOLN
2.5000 [IU]/h | INTRAVENOUS | Status: DC
  Administered 2021-05-13: 2.5 [IU]/h via INTRAVENOUS
  Filled 2021-05-12: qty 500

## 2021-05-12 MED ORDER — MISOPROSTOL 25 MCG QUARTER TABLET
25.0000 ug | ORAL_TABLET | ORAL | Status: DC | PRN
  Administered 2021-05-12: 25 ug via VAGINAL
  Filled 2021-05-12: qty 1

## 2021-05-12 MED ORDER — OXYCODONE-ACETAMINOPHEN 5-325 MG PO TABS: 2.0000 | ORAL_TABLET | ORAL | Status: DC | PRN

## 2021-05-12 MED ORDER — DIPHENHYDRAMINE HCL 50 MG/ML IJ SOLN: 12.5000 mg | INTRAMUSCULAR | Status: DC | PRN

## 2021-05-12 MED ORDER — EPHEDRINE 5 MG/ML INJ: 10.0000 mg | INTRAVENOUS | Status: DC | PRN

## 2021-05-12 MED ORDER — LIDOCAINE HCL (PF) 1 % IJ SOLN
INTRAMUSCULAR | Status: DC | PRN
  Administered 2021-05-12: 2 mL via EPIDURAL
  Administered 2021-05-12: 10 mL via EPIDURAL

## 2021-05-12 MED ORDER — FENTANYL-BUPIVACAINE-NACL 0.5-0.125-0.9 MG/250ML-% EP SOLN
EPIDURAL | Status: DC | PRN
  Administered 2021-05-12: 12 mL/h via EPIDURAL

## 2021-05-12 MED ORDER — PHENYLEPHRINE 40 MCG/ML (10ML) SYRINGE FOR IV PUSH (FOR BLOOD PRESSURE SUPPORT): 80.0000 ug | PREFILLED_SYRINGE | INTRAVENOUS | Status: DC | PRN

## 2021-05-12 MED ORDER — ACETAMINOPHEN 325 MG PO TABS: 650.0000 mg | ORAL_TABLET | ORAL | Status: DC | PRN

## 2021-05-12 MED ORDER — HYDROXYZINE HCL 50 MG PO TABS: 50.0000 mg | ORAL_TABLET | Freq: Four times a day (QID) | ORAL | Status: DC | PRN

## 2021-05-12 MED ORDER — FENTANYL CITRATE (PF) 100 MCG/2ML IJ SOLN: 50.0000 ug | INTRAMUSCULAR | Status: DC | PRN

## 2021-05-12 MED ORDER — FENTANYL-BUPIVACAINE-NACL 0.5-0.125-0.9 MG/250ML-% EP SOLN
12.0000 mL/h | EPIDURAL | Status: DC | PRN
  Filled 2021-05-12: qty 250

## 2021-05-12 MED ORDER — SOD CITRATE-CITRIC ACID 500-334 MG/5ML PO SOLN: 30.0000 mL | ORAL | Status: DC | PRN

## 2021-05-12 MED ORDER — LIDOCAINE HCL (PF) 1 % IJ SOLN: 30.0000 mL | INTRAMUSCULAR | Status: DC | PRN

## 2021-05-12 MED ORDER — OXYCODONE-ACETAMINOPHEN 5-325 MG PO TABS: 1.0000 | ORAL_TABLET | ORAL | Status: DC | PRN

## 2021-05-12 MED ORDER — ONDANSETRON HCL 4 MG/2ML IJ SOLN: 4.0000 mg | Freq: Four times a day (QID) | INTRAMUSCULAR | Status: DC | PRN

## 2021-05-12 MED ORDER — OXYTOCIN BOLUS FROM INFUSION
333.0000 mL | Freq: Once | INTRAVENOUS | Status: AC
  Administered 2021-05-13: 333 mL via INTRAVENOUS

## 2021-05-12 MED ORDER — OXYTOCIN-SODIUM CHLORIDE 30-0.9 UT/500ML-% IV SOLN
1.0000 m[IU]/min | INTRAVENOUS | Status: DC
  Administered 2021-05-12: 2 m[IU]/min via INTRAVENOUS

## 2021-05-12 NOTE — H&P (Signed)
Tricia Mayo is a 20 y.o. female G1P0 at 53 weeks and 5 days presenting for elective induction at term. Pregnancy is complicated by h/o pyelonephritis and kidney stone during third trimester of pregnancy that has since resolved.  She had a Nephrostomy tube placed in the right kidney during the third trimester which was removed after passage of the kidney stone. Prenatal care provided Dr. Gerald Leitz with [facility].   OB History     Gravida  1   Para      Term      Preterm      AB      Living         SAB      IAB      Ectopic      Multiple      Live Births             Past Medical History:  Diagnosis Date   Kidney stones    Past Surgical History:  Procedure Laterality Date   IR NEPHROSTOMY EXCHANGE RIGHT  04/07/2021   IR NEPHROSTOMY PLACEMENT RIGHT  03/27/2021   NO PAST SURGERIES     Family History: family history is not on file. Social History:  reports that she has never smoked. She has never used smokeless tobacco. She reports that she does not currently use alcohol. She reports that she does not use drugs.     Maternal Diabetes: No Genetic Screening: Declined Maternal Ultrasounds/Referrals: Normal Fetal Ultrasounds or other Referrals:  None Maternal Substance Abuse:  No Significant Maternal Medications:  None Significant Maternal Lab Results:  Group B Strep negative Other Comments:  None  Review of Systems  Constitutional: Negative.   HENT: Negative.    Eyes: Negative.   Respiratory: Negative.    Cardiovascular: Negative.   Gastrointestinal: Negative.   Endocrine: Negative.   Genitourinary: Negative.   Musculoskeletal: Negative.   Skin: Negative.   Allergic/Immunologic: Negative.   Neurological: Negative.   Hematological: Negative.   Psychiatric/Behavioral: Negative.    History Dilation: 2.5 Effacement (%): 70 Station: -3 Exam by:: Dr. Richardson Dopp Blood pressure 130/72, pulse 88, temperature 97.9 F (36.6 C), temperature source Oral, resp.  rate 15, height 5\' 5"  (1.651 m), weight 84.5 kg, SpO2 98 %. Maternal Exam:  Introitus: Normal vulva.  Physical Exam Constitutional:      Appearance: Normal appearance.  HENT:     Head: Normocephalic and atraumatic.     Nose: Nose normal.     Mouth/Throat:     Mouth: Mucous membranes are dry.  Eyes:     Pupils: Pupils are equal, round, and reactive to light.  Cardiovascular:     Rate and Rhythm: Normal rate and regular rhythm.  Pulmonary:     Effort: Pulmonary effort is normal.     Breath sounds: Normal breath sounds.  Abdominal:     Tenderness: There is no abdominal tenderness.  Genitourinary:    General: Normal vulva.  Musculoskeletal:        General: Swelling present. Normal range of motion.     Cervical back: Normal range of motion and neck supple.  Skin:    General: Skin is warm and dry.  Neurological:     General: No focal deficit present.     Mental Status: She is alert and oriented to person, place, and time.  Psychiatric:        Mood and Affect: Mood normal.        Behavior: Behavior normal.    Prenatal  labs: ABO, Rh: --/--/B POS (12/15 1242) Antibody: NEG (12/15 1242) Rubella:  Immune RPR:   Nonreactive  HBsAg:  Negative   HIV:   Negative  GBS:   Negative   Assessment/Plan: 39 weeks and 5 days for elective induction per patient request.  Cytotec for cervical ripening x 1 completed  - start pitocin 2x2 plan AROM with fetal descent.  -anticipate svd  CCOB covering after 7 pm tonight.  Dr. Connye Burkitt will assume care at 7 am tomorrow morning.    Gerald Leitz 05/12/2021, 5:56 PM

## 2021-05-12 NOTE — Progress Notes (Signed)
Subjective:    Comfortable with epidural  Objective:    VS: BP 120/60    Pulse 71    Temp 97.8 F (36.6 C) (Oral)    Resp 16    Ht 5\' 5"  (1.651 m)    Wt 84.5 kg    SpO2 98%    BMI 30.99 kg/m  FHR : baseline 130 / variability moderate / accelerations present / no decelerations Toco: contractions every 2-3 minutes  Membranes: SROM Dilation: 4.5 Effacement (%): 80 Cervical Position: Middle Station: -2, -1 Presentation: Vertex Exam by:: [email or web address], RN Pitocin 4 mU/min  Assessment/Plan:   20 y.o. G1P0 [redacted]w[redacted]d  Labor: Progressing normally Fetal Wellbeing:  Category I Pain Control:  Epidural I/D:   GBS negative , afebrile Anticipated MOD:  NSVD  Dr. [redacted]w[redacted]d updated  Normand Sloop MSN, CNM 05/12/2021 10:45 PM

## 2021-05-12 NOTE — Anesthesia Preprocedure Evaluation (Signed)
Anesthesia Evaluation  Patient identified by MRN, date of birth, ID band Patient awake    Reviewed: Allergy & Precautions, Patient's Chart, lab work & pertinent test results  Airway Mallampati: II  TM Distance: >3 FB Neck ROM: Full    Dental no notable dental hx.    Pulmonary neg pulmonary ROS,    Pulmonary exam normal breath sounds clear to auscultation       Cardiovascular negative cardio ROS Normal cardiovascular exam Rhythm:Regular Rate:Normal     Neuro/Psych negative neurological ROS  negative psych ROS   GI/Hepatic negative GI ROS, Neg liver ROS,   Endo/Other  negative endocrine ROS  Renal/GU Renal disease (nephrolithiasis requiring nephrostomy tube during this pregnancy)  negative genitourinary   Musculoskeletal negative musculoskeletal ROS (+)   Abdominal   Peds negative pediatric ROS (+)  Hematology negative hematology ROS (+) hct 37.6, plt 274   Anesthesia Other Findings   Reproductive/Obstetrics (+) Pregnancy                             Anesthesia Physical Anesthesia Plan  ASA: 2  Anesthesia Plan: Epidural   Post-op Pain Management:    Induction:   PONV Risk Score and Plan: 2  Airway Management Planned: Natural Airway  Additional Equipment: None  Intra-op Plan:   Post-operative Plan:   Informed Consent: I have reviewed the patients History and Physical, chart, labs and discussed the procedure including the risks, benefits and alternatives for the proposed anesthesia with the patient or authorized representative who has indicated his/her understanding and acceptance.       Plan Discussed with:   Anesthesia Plan Comments:         Anesthesia Quick Evaluation

## 2021-05-12 NOTE — Progress Notes (Signed)
Subjective:    In room to meet patient and family. Patient uncomfortable with contractions and requesting epidural.   Objective:    VS: BP 125/60 (BP Location: Left Arm)    Pulse 84    Temp 98.9 F (37.2 C) (Oral)    Resp 19    Ht 5\' 5"  (1.651 m)    Wt 84.5 kg    SpO2 98%    BMI 30.99 kg/m  FHR : baseline 140 / variability moderate / accelerations 15x15 / no decelerations Toco: contractions every 2-4 minutes  Membranes: SROM@1857  Dilation: 2.5 Effacement (%): 70 Cervical Position: Posterior Station: -3 Presentation: Vertex Exam by:: Dr. [email or web address] Pitocin 2 mU/min  Assessment/Plan:   20 y.o. G1P0 [redacted]w[redacted]d  Labor: Early labor. Will continue to titrate pitocin for adequate contractions Preeclampsia:   stable blood pressure, patient denies all symptoms Fetal Wellbeing:  Category I Pain Control:  Requesting epidural I/D:   GBS negative Anticipated MOD:  NSVD  [redacted]w[redacted]d MSN, CNM 05/12/2021 7:46 PM

## 2021-05-12 NOTE — Anesthesia Procedure Notes (Signed)
Epidural Patient location during procedure: OB Start time: 05/12/2021 8:03 PM End time: 05/12/2021 8:11 PM  Staffing Anesthesiologist: Lannie Fields, DO Performed: anesthesiologist   Preanesthetic Checklist Completed: patient identified, IV checked, risks and benefits discussed, monitors and equipment checked, pre-op evaluation and timeout performed  Epidural Patient position: sitting Prep: DuraPrep and site prepped and draped Patient monitoring: continuous pulse ox, blood pressure, heart rate and cardiac monitor Approach: midline Location: L3-L4 Injection technique: LOR air  Needle:  Needle type: Tuohy  Needle gauge: 17 G Needle length: 9 cm Needle insertion depth: 6 cm Catheter type: closed end flexible Catheter size: 19 Gauge Catheter at skin depth: 11 cm Test dose: negative  Assessment Sensory level: T8 Events: blood not aspirated, injection not painful, no injection resistance, no paresthesia and negative IV test  Additional Notes Patient identified. Risks/Benefits/Options discussed with patient including but not limited to bleeding, infection, nerve damage, paralysis, failed block, incomplete pain control, headache, blood pressure changes, nausea, vomiting, reactions to medication both or allergic, itching and postpartum back pain. Confirmed with bedside nurse the patient's most recent platelet count. Confirmed with patient that they are not currently taking any anticoagulation, have any bleeding history or any family history of bleeding disorders. Patient expressed understanding and wished to proceed. All questions were answered. Sterile technique was used throughout the entire procedure. Please see nursing notes for vital signs. Test dose was given through epidural catheter and negative prior to continuing to dose epidural or start infusion. Warning signs of high block given to the patient including shortness of breath, tingling/numbness in hands, complete motor  block, or any concerning symptoms with instructions to call for help. Patient was given instructions on fall risk and not to get out of bed. All questions and concerns addressed with instructions to call with any issues or inadequate analgesia.  Reason for block:procedure for pain

## 2021-05-13 ENCOUNTER — Encounter (HOSPITAL_COMMUNITY): Payer: Self-pay | Admitting: Obstetrics and Gynecology

## 2021-05-13 LAB — CBC
HCT: 34.5 % — ABNORMAL LOW (ref 36.0–46.0)
Hemoglobin: 11.5 g/dL — ABNORMAL LOW (ref 12.0–15.0)
MCH: 30 pg (ref 26.0–34.0)
MCHC: 33.3 g/dL (ref 30.0–36.0)
MCV: 90.1 fL (ref 80.0–100.0)
Platelets: 229 10*3/uL (ref 150–400)
RBC: 3.83 MIL/uL — ABNORMAL LOW (ref 3.87–5.11)
RDW: 12 % (ref 11.5–15.5)
WBC: 12.8 10*3/uL — ABNORMAL HIGH (ref 4.0–10.5)
nRBC: 0 % (ref 0.0–0.2)

## 2021-05-13 LAB — RPR: RPR Ser Ql: NONREACTIVE

## 2021-05-13 MED ORDER — OXYCODONE HCL 5 MG PO TABS: 5.0000 mg | ORAL_TABLET | ORAL | Status: DC | PRN

## 2021-05-13 MED ORDER — DIBUCAINE (PERIANAL) 1 % EX OINT: 1.0000 "application " | TOPICAL_OINTMENT | CUTANEOUS | Status: DC | PRN

## 2021-05-13 MED ORDER — SIMETHICONE 80 MG PO CHEW: 80.0000 mg | CHEWABLE_TABLET | ORAL | Status: DC | PRN

## 2021-05-13 MED ORDER — ONDANSETRON HCL 4 MG PO TABS: 4.0000 mg | ORAL_TABLET | ORAL | Status: DC | PRN

## 2021-05-13 MED ORDER — PRENATAL MULTIVITAMIN CH
1.0000 | ORAL_TABLET | Freq: Every day | ORAL | Status: DC
  Administered 2021-05-14: 1 via ORAL
  Filled 2021-05-13: qty 1

## 2021-05-13 MED ORDER — ACETAMINOPHEN 325 MG PO TABS
650.0000 mg | ORAL_TABLET | ORAL | Status: DC | PRN
  Administered 2021-05-13: 650 mg via ORAL
  Filled 2021-05-13 (×2): qty 2

## 2021-05-13 MED ORDER — SENNOSIDES-DOCUSATE SODIUM 8.6-50 MG PO TABS
2.0000 | ORAL_TABLET | Freq: Every day | ORAL | Status: DC
  Administered 2021-05-14: 2 via ORAL
  Filled 2021-05-13: qty 2

## 2021-05-13 MED ORDER — ONDANSETRON HCL 4 MG/2ML IJ SOLN: 4.0000 mg | INTRAMUSCULAR | Status: DC | PRN

## 2021-05-13 MED ORDER — DIPHENHYDRAMINE HCL 25 MG PO CAPS: 25.0000 mg | ORAL_CAPSULE | Freq: Four times a day (QID) | ORAL | Status: DC | PRN

## 2021-05-13 MED ORDER — IBUPROFEN 600 MG PO TABS
600.0000 mg | ORAL_TABLET | Freq: Four times a day (QID) | ORAL | Status: DC
  Administered 2021-05-13 – 2021-05-14 (×4): 600 mg via ORAL
  Filled 2021-05-13 (×6): qty 1

## 2021-05-13 MED ORDER — TETANUS-DIPHTH-ACELL PERTUSSIS 5-2.5-18.5 LF-MCG/0.5 IM SUSY: 0.5000 mL | PREFILLED_SYRINGE | Freq: Once | INTRAMUSCULAR | Status: DC

## 2021-05-13 MED ORDER — INFLUENZA VAC SPLIT QUAD 0.5 ML IM SUSY
0.5000 mL | PREFILLED_SYRINGE | INTRAMUSCULAR | Status: DC
  Filled 2021-05-13: qty 0.5

## 2021-05-13 MED ORDER — BENZOCAINE-MENTHOL 20-0.5 % EX AERO
1.0000 "application " | INHALATION_SPRAY | CUTANEOUS | Status: DC | PRN
  Administered 2021-05-13: 1 via TOPICAL
  Filled 2021-05-13: qty 56

## 2021-05-13 MED ORDER — COCONUT OIL OIL
1.0000 "application " | TOPICAL_OIL | Status: DC | PRN
  Administered 2021-05-13: 1 via TOPICAL

## 2021-05-13 MED ORDER — WITCH HAZEL-GLYCERIN EX PADS: 1.0000 "application " | MEDICATED_PAD | CUTANEOUS | Status: DC | PRN

## 2021-05-13 MED ORDER — ZOLPIDEM TARTRATE 5 MG PO TABS: 5.0000 mg | ORAL_TABLET | Freq: Every evening | ORAL | Status: DC | PRN

## 2021-05-13 MED ORDER — OXYCODONE HCL 5 MG PO TABS: 10.0000 mg | ORAL_TABLET | ORAL | Status: DC | PRN

## 2021-05-13 NOTE — Lactation Note (Signed)
This note was copied from a baby's chart. Lactation Consultation Note  Patient Name: Tricia Mayo [id number] Date: 05/13/2021   Age:21 hours  Mom/family was worried that it had almost been 4 hrs since infant fed. Infant was not interested in latching despite attempt. Hand expression was taught to Mom & the resulting drops were fed to infant on a clean glove. Infant was noted to have excellent tongue lateralization.   Initial newborn sleepiness was discussed with family & I reassured them. [facility] student & I discussed early feeding cues.   Mom does not have a pump at home; I provided her with a hand pump and a size 21 flange. I do not think Mom needs to pump at this time, but she was shown how to use, clean, and reassemble.   Mom has an abrasion at the base of R nipple (about 9 o'clock) from a previous latch. RN will bring coconut oil.   [facility] student went over our [facility] brochure (O/P services, breastfeeding support groups, and our phone # for post-discharge questions). Mom knows how to reach [place] if she'd like for [place] to return.  FOB is in [facility] for [employer]. He will arrive on Monday.  [name] [facility] 05/13/2021, 12:22 PM

## 2021-05-13 NOTE — Lactation Note (Signed)
This note was copied from a baby's chart. Lactation Consultation Note  Patient Name: Tricia Mayo [id number] Date: 05/13/2021 Reason for consult: Mother's request;Term;[place] Age:21 hours  Mom called for assist. I assisted with latching infant with Mom in side-lying position. Infant latched briefly with a few suckles, but then fell asleep. I normalized this for Mom.  Coconut oil was provided to Mom and she self-applied it to her nipples.   [name] [facility] 05/13/2021, 3:06 PM

## 2021-05-13 NOTE — Progress Notes (Signed)
° °  Objective:    VS: BP 123/62    Pulse 71    Temp (!) 97.5 F (36.4 C) (Oral)    Resp 16    Ht 5\' 5"  (1.651 m)    Wt 84.5 kg    SpO2 98%    BMI 30.99 kg/m  FHR : baseline 130 / variability moderate / accelerations 15x15 / none decelerations Toco: contractions every 2-3 minutes  Membranes: SROM Dilation: Lip/rim (maternal left) Effacement (%): 100 Cervical Position: Middle Station: 0, Plus 1 Presentation: Vertex Exam by:: [email or web address] B, RN Pitocin 6 mU/min  Assessment/Plan:   20 y.o. G1P0 [redacted]w[redacted]d  Labor: Progressing normally. Will begin pushing shortly Fetal Wellbeing:  Category I Pain Control:  Epidural Anticipated MOD:  NSVD  [redacted]w[redacted]d MSN, CNM 05/13/2021 2:42 AM

## 2021-05-13 NOTE — Progress Notes (Signed)
PPD#0 SVD. Briefly in to congratulate after SVD. Patient doing and feeling well. Does not desire circumcision for female infant.  Steva Ready, DO

## 2021-05-13 NOTE — Anesthesia Postprocedure Evaluation (Signed)
Anesthesia Post Note  Patient: Tricia Mayo  Procedure(s) Performed: AN AD HOC LABOR EPIDURAL     Patient location during evaluation: Mother Baby Anesthesia Type: Epidural Level of consciousness: awake and alert Pain management: pain level controlled Vital Signs Assessment: post-procedure vital signs reviewed and stable Respiratory status: spontaneous breathing, nonlabored ventilation and respiratory function stable Cardiovascular status: stable Postop Assessment: no headache, no backache, epidural receding, no apparent nausea or vomiting, patient able to bend at knees, adequate PO intake and able to ambulate Anesthetic complications: no   No notable events documented.  Last Vitals:  Vitals:   05/13/21 0630 05/13/21 0715  BP: 130/80 120/77  Pulse: 91 92  Resp: 18 18  Temp: 36.9 C 36.8 C  SpO2: 100% 98%    Last Pain:  Vitals:   05/13/21 1115  TempSrc:   PainSc: 0-No pain   Pain Goal:                   [employer]

## 2021-05-14 ENCOUNTER — Encounter (HOSPITAL_COMMUNITY): Payer: Self-pay | Admitting: Obstetrics and Gynecology

## 2021-05-14 MED ORDER — IBUPROFEN 600 MG PO TABS: 600.0000 mg | ORAL_TABLET | Freq: Four times a day (QID) | ORAL | 0 refills | Status: AC

## 2021-05-14 NOTE — Discharge Summary (Signed)
SVD OB Discharge Summary     Patient Name: Tricia Mayo DOB: 2000/12/23 MRN: [id number]  Date of admission: 05/12/2021 Delivering MD: Oley Balm  Date of delivery: 05/13/2021 Type of delivery: SVD  Newborn Data: Sex: Baby female Circumcision: Does not desire. Live born female.  Birth Weight: 7 lb 1.8 oz (3226 g) APGAR: 6, 8  Newborn Delivery   Birth date/time: 05/13/2021 03:56:00 Delivery type: Vaginal, Spontaneous     Feeding: breast Infant being discharge to home with mother in stable condition.   Admitting diagnosis: Term pregnancy [Z34.90] SVD (spontaneous vaginal delivery) [O80] Intrauterine pregnancy: [redacted]w[redacted]d     Secondary diagnosis:  Principal Problem:   SVD (spontaneous vaginal delivery) Active Problems:   Term pregnancy   Normal postpartum course                                Complications: None                                                              Intrapartum Procedures: spontaneous vaginal delivery Postpartum Procedures: none Complications-Operative and Postpartum: none and 2nd degree perineal laceration Augmentation: Pitocin and Cytotec   History of Present Illness: Tricia Mayo is a 20 y.o. female, G1P1001, who presents at [redacted]w[redacted]d weeks gestation. The patient has been followed at [facility]  Her pregnancy has been complicated by:  Patient Active Problem List   Diagnosis Date Noted   Normal postpartum course 05/14/2021   SVD (spontaneous vaginal delivery) 05/13/2021   Term pregnancy 05/12/2021   Pyelonephritis 03/29/2021   Nephrolithiasis 03/26/2021     Active Ambulatory Problems    Diagnosis Date Noted   Nephrolithiasis 03/26/2021   Pyelonephritis 03/29/2021   Resolved Ambulatory Problems    Diagnosis Date Noted   No Resolved Ambulatory Problems   Past Medical History:  Diagnosis Date   Kidney stones      Hospital course:  Induction of Labor With Vaginal Delivery   20 y.o. yo G1P1001 at [redacted]w[redacted]d was  admitted to the hospital 05/12/2021 for induction of labor.  Indication for induction: Elective.  Patient had an uncomplicated labor course as follows: Membrane Rupture Time/Date: 6:57 PM ,05/12/2021   Delivery Method:Vaginal, Spontaneous  Episiotomy: None  Lacerations:  2nd degree  Details of delivery can be found in separate delivery note.  Patient had a routine postpartum course. Patient is discharged home 05/14/21.  Newborn Data: Birth date:05/13/2021  Birth time:3:56 AM  Gender:Female  Living status:Living  Apgars:6 ,8  Weight:3226 g  Postpartum Day # 2 : S/P NSVD due to admitted on 12/15 for elective term IOL, progressed with cytotec and pitocin, to have an SVD 12/16 over 2nd degree laceration with hgb drop of 12.-11.5. Patient up ad lib, denies syncope or dizziness. Reports consuming regular diet without issues and denies N/V. Patient reports 0 bowel movement + passing flatus.  Denies issues with urination and reports bleeding is "lighter."  Patient is breastfeeding and reports going well.  Desires undecided for postpartum contraception.  Pain is being appropriately managed with use of po meds.    Physical exam  Vitals:   05/13/21 1115 05/13/21 1530 05/13/21 1930 05/14/21 0511  BP: 124/81 119/78 126/70 120/80  Pulse: 89  94 98 83  Resp: 16 16 18 18   Temp:  98 F (36.7 C) 98.9 F (37.2 C) 98.1 F (36.7 C)  TempSrc:  Oral Oral Oral  SpO2: 99% 100% 99% 99%  Weight:      Height:       General: alert, cooperative, and no distress Lochia: appropriate Uterine Fundus: firm Incision: Healing well with no significant drainage, No significant erythema, Dressing is clean, dry, and intact, honeycomb dressing CDI Perineum: approximate DVT Evaluation: No evidence of DVT seen on physical exam. Negative Homan's sign. No cords or calf tenderness. No significant calf/ankle edema.  Labs: Lab Results  Component Value Date   WBC 12.8 (H) 05/13/2021   HGB 11.5 (L) 05/13/2021   HCT 34.5 (L)  05/13/2021   MCV 90.1 05/13/2021   PLT 229 05/13/2021   CMP Latest Ref Rng & Units 03/26/2021  Glucose 70 - 99 mg/dL 97  BUN 6 - 20 mg/dL 03/28/2021)  Creatinine <1(Z - 1.00 mg/dL 0.01  Sodium 7.49 - 449 mmol/L 135  Potassium 3.5 - 5.1 mmol/L 3.8  Chloride 98 - 111 mmol/L 103  CO2 22 - 32 mmol/L 22  Calcium 8.9 - 10.3 mg/dL 9.1  Total Protein 6.5 - 8.1 g/dL 6.5  Total Bilirubin 0.3 - 1.2 mg/dL 0.9  Alkaline Phos 38 - 126 U/L 103  AST 15 - 41 U/L 22  ALT 0 - 44 U/L 14    Date of discharge: 05/14/2021 Discharge Diagnoses: Term Pregnancy-delivered Discharge instruction: per After Visit Summary and "Baby and Me Booklet".  After visit meds:   Activity:           unrestricted and pelvic rest Advance as tolerated. Pelvic rest for 6 weeks.  Diet:                routine Medications: PNV and Ibuprofen Postpartum contraception: Undecided Condition:  Pt discharge to home with baby in stable  Meds: Allergies as of 05/14/2021   No Known Allergies      Medication List     STOP taking these medications    BD PosiFlush 0.9 % Soln injection Generic drug: sodium chloride flush   cephALEXin 500 MG capsule Commonly known as: KEFLEX   metoCLOPramide 10 MG tablet Commonly known as: REGLAN   NIFEdipine 10 MG capsule Commonly known as: PROCARDIA   oxyCODONE 5 MG immediate release tablet Commonly known as: Oxy IR/ROXICODONE   tamsulosin 0.4 MG Caps capsule Commonly known as: Flomax   terconazole 0.4 % vaginal cream Commonly known as: TERAZOL 7       TAKE these medications    acetaminophen 325 MG tablet Commonly known as: TYLENOL Take 2 tablets (650 mg total) by mouth every 4 (four) hours as needed for mild pain or moderate pain (for pain scale < 4  OR  temperature  >/=  100.5 F).   ibuprofen 600 MG tablet Commonly known as: ADVIL Take 1 tablet (600 mg total) by mouth every 6 (six) hours.   prenatal vitamin w/FE, FA 27-1 MG Tabs tablet Take 1 tablet by mouth daily at 12  noon.        Discharge Follow Up:   Follow-up Information     Gynecology, [facility]. Schedule an appointment as soon as possible for a visit in 6 week(s).   Specialty: Obstetrics and Gynecology Contact information: [address] [place] [phone]  [place], NP-C, CNM 05/14/2021, 9:54 AM  Dale [place], FNP

## 2021-05-26 ENCOUNTER — Telehealth (HOSPITAL_COMMUNITY): Payer: Self-pay | Admitting: *Deleted

## 2021-05-26 NOTE — Telephone Encounter (Signed)
Hospital Discharge Follow-Up Call:  Patient reports that she is well and has no concerns about her healing process.  EPDS today was 1 and she endorses this accurately reflects that she is doing well emotionally.  Patient says that baby is well, but does still look "a little yellow, like in his eyes."  She has been putting baby in indirect sunlight as directed by pediatrician.  Next pediatrician appointment is for 6 week check-up.  Advised patient to phone pediatricians office to see if another bilirubin check is indicated.  She reports that baby sleeps in a crib beside parent's bed.  ABCs of Safe Sleep reviewed.

## 2022-09-27 IMAGING — XA IR CATHETER TUBE CHANGE
3 series · 14 of 14 positions shown · non-contrast
Comparison: None.

INDICATION: 20-year-old pregnant patient status post right nephrostomy drain
placement on 03/28/2021 for treatment of hydronephrosis returns with
decreased urine output from nephrostomy drain.

EXAM:
Fluoroscopy guided upsize of right nephrostomy drain

[Series 2: fl neuro · 4 of 37 frames shown (1 of 2)]
[frame 6/37]
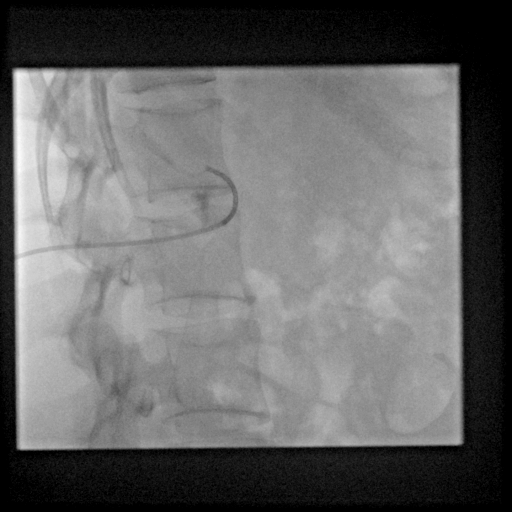
[frame 12/37]
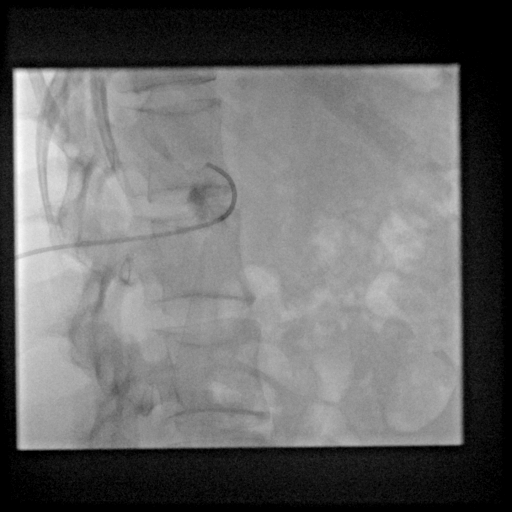
[frame 19/37]
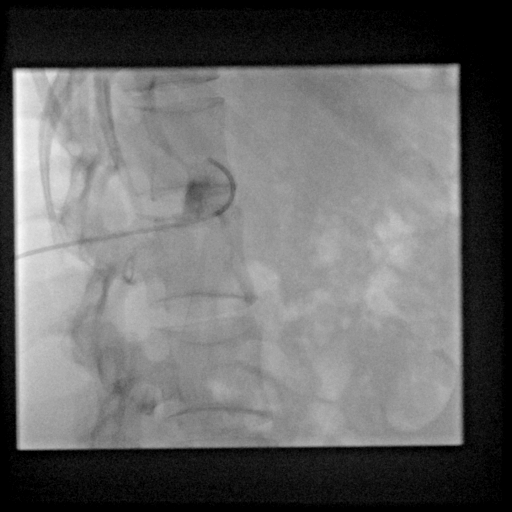
[frame 32/37]
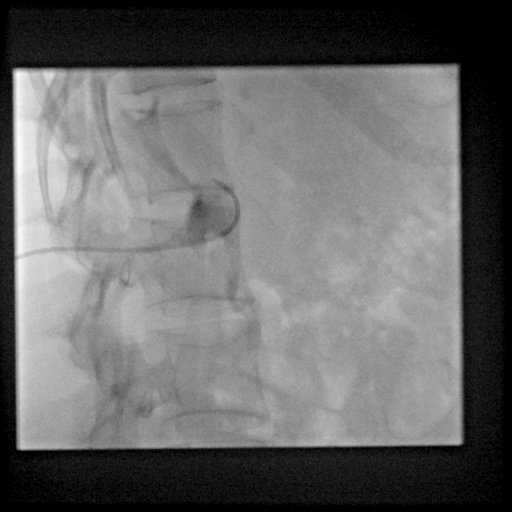

[Series 3: fl neuro · 4 of 22 frames shown (2 of 2)]
[frame 1/22]
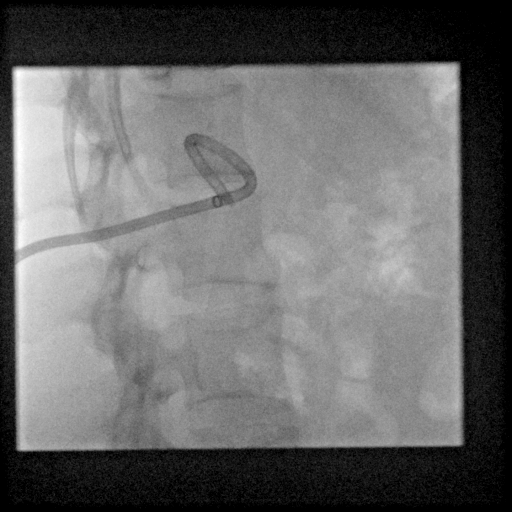
[frame 4/22]
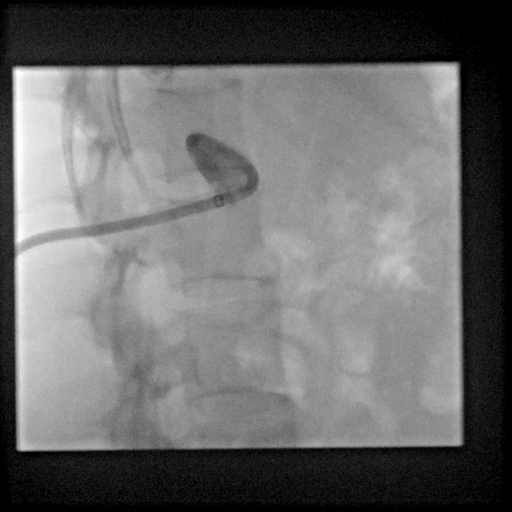
[frame 12/22]
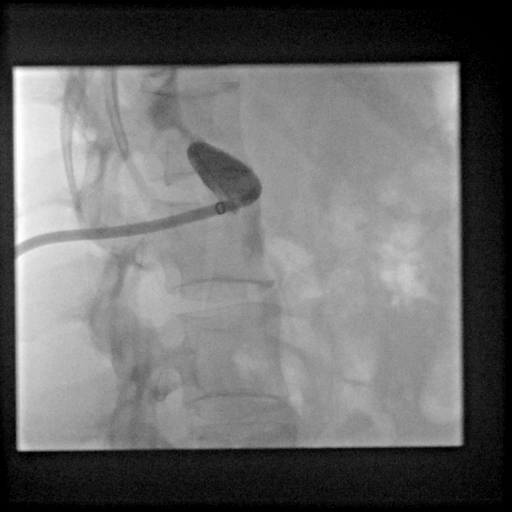
[frame 19/22]
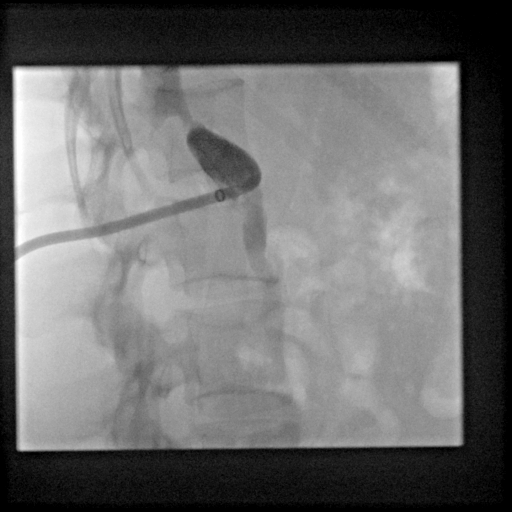

[Series 300: ir nephrostogram right thru existing acc · 6 of 6 slices shown]
[im 1/6]
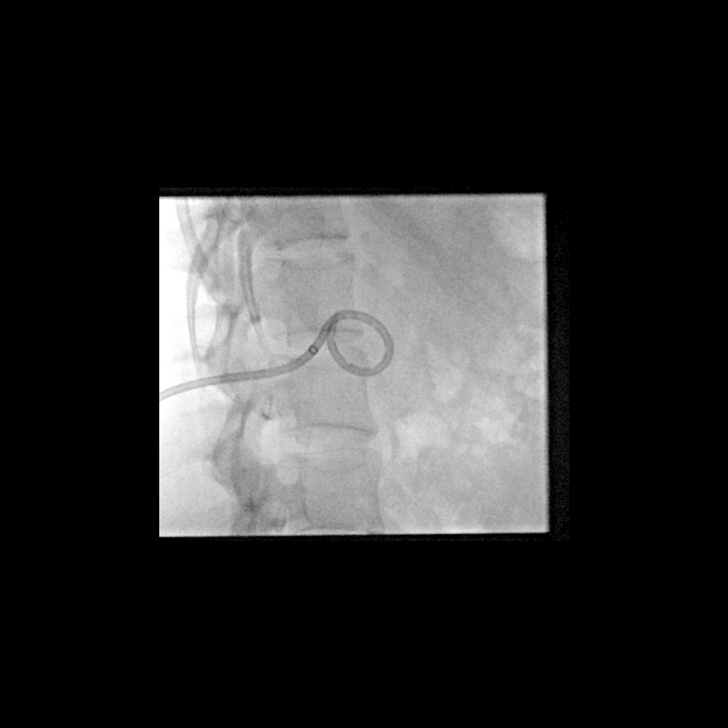
[im 2/6]
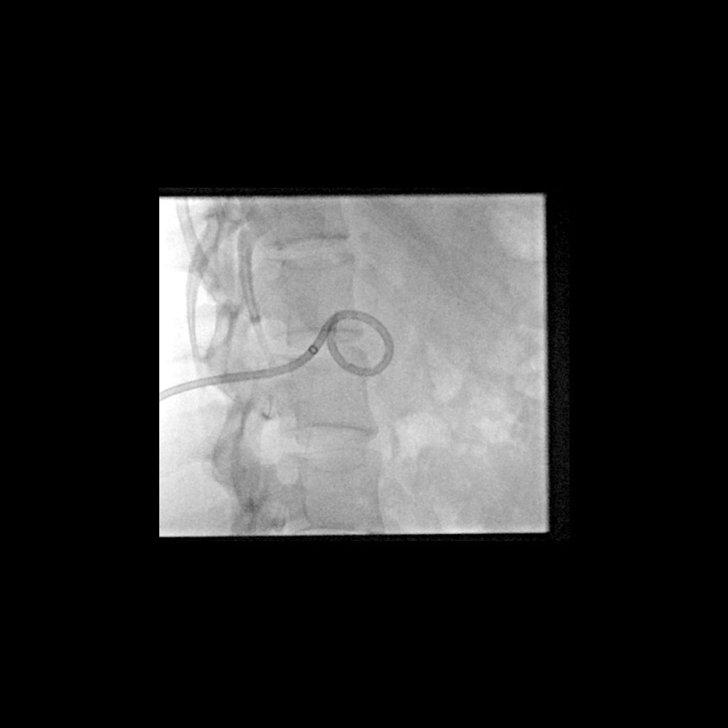
[im 3/6]
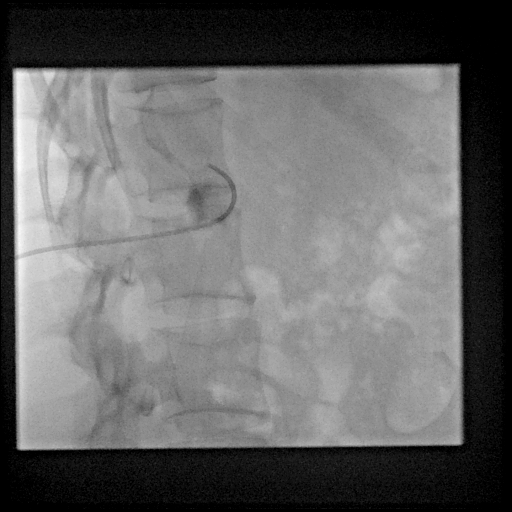
[im 4/6]
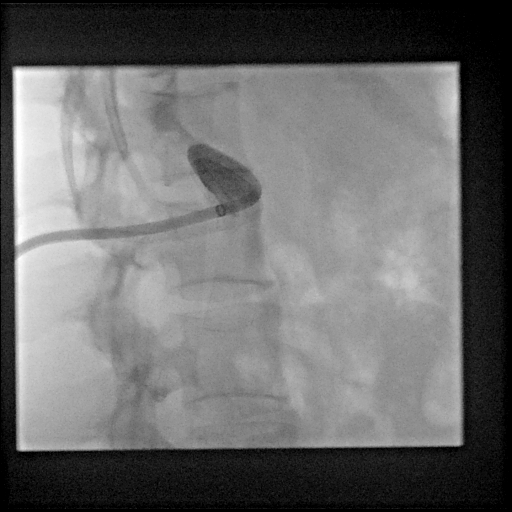
[im 5/6]
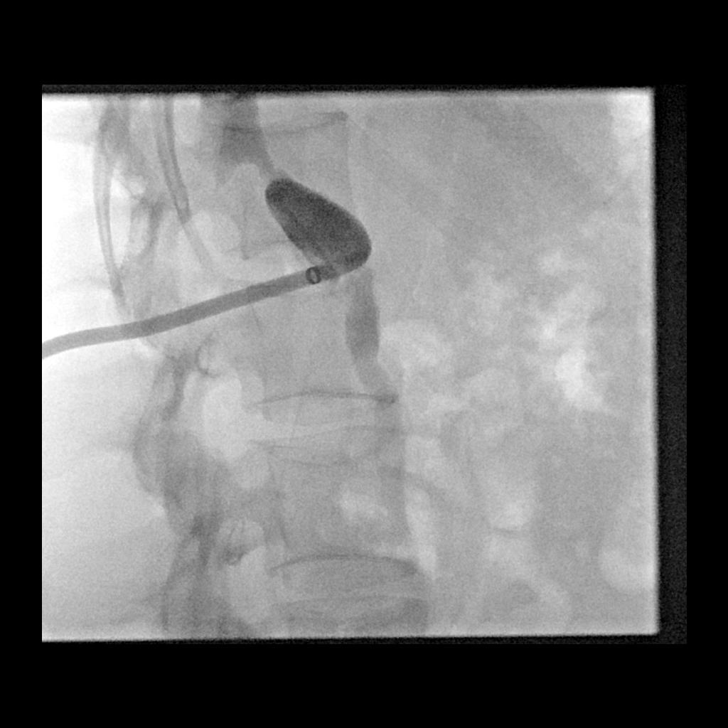
[im 6/6]
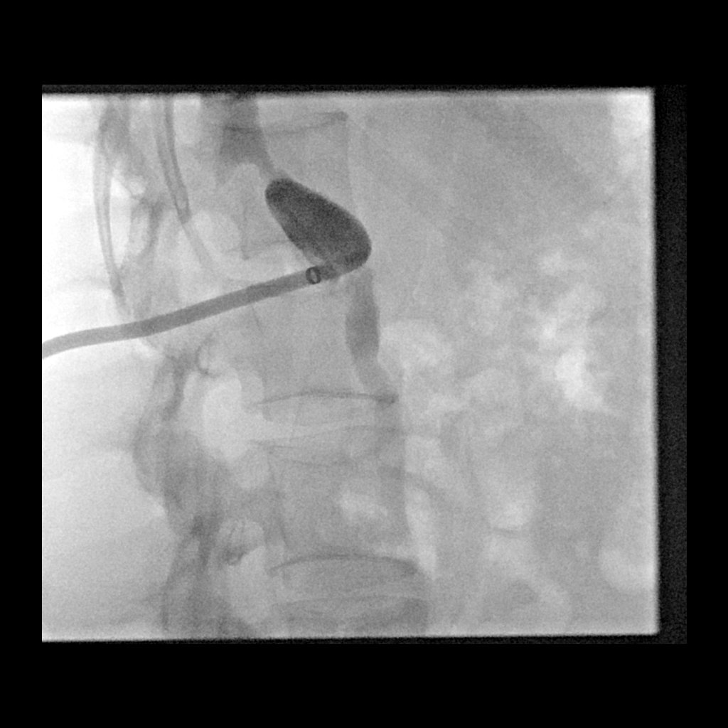

[14 of 14 positions shown; findings below may reference images not displayed]

MEDICATIONS:
None

ANESTHESIA/SEDATION:
None

CONTRAST:  20 mL of Omnipaque 300-administered into the collecting
system(s)

FLUOROSCOPY TIME:  Fluoroscopy Time: 3.2 minutes (158 mGy).

COMPLICATIONS:
None immediate.

PROCEDURE:
Informed written consent was obtained from the patient after a
thorough discussion of the procedural risks, benefits and
alternatives. All questions were addressed. Maximal Sterile Barrier
Technique was utilized including caps, mask, sterile gowns, sterile
gloves, sterile drape, hand hygiene and skin antiseptic. A timeout
was performed prior to the initiation of the procedure.

Patient position left lateral decubitus on the angiography table.
The external segment of the existing drain and surrounding skin
prepped and draped in the usual fashion. Scout image demonstrated
the drain in appropriate position, however it could not be flushed.
Guidewire also could not be advanced through the drain due to
extensive incrustation. The drain was cut near the skin entry site
and back and of stiff glidewire was utilized to create a passage
through the encrusted drain. The drain was eventually successfully
removed over 0.035 inch glidewire.

Kumpe the catheter was inserted over the Glidewire and contrast
administered under fluoroscopy to confirm appropriate positioning
within the renal collecting system. Kumpe catheter exchanged for 12
French multipurpose pigtail drain over stiff glidewire.

The right nephrostomy drain was secured to skin with silk suture and
connected to bag.
IMPRESSION: Successful exchange and up size of 10.2 French in cuspid and
occluded right nephrostomy drain for 12 French multipurpose pigtail
drain.

PLAN:
1. Patient is high risk for drain inclusion given increased urinary
sedimentation during pregnancy.
2. Patient was instructed to flush drain twice daily with 10 mL of
normal saline.
3. Patient instructed to contact IR if drain output decreases.
4. Patient should return in 2 weeks for routine exchange.

## 2024-02-04 ENCOUNTER — Other Ambulatory Visit: Payer: Self-pay

## 2024-02-04 ENCOUNTER — Emergency Department (HOSPITAL_COMMUNITY): Payer: Self-pay

## 2024-02-04 ENCOUNTER — Encounter (HOSPITAL_COMMUNITY): Payer: Self-pay | Admitting: Emergency Medicine

## 2024-02-04 ENCOUNTER — Emergency Department (HOSPITAL_COMMUNITY)
Admission: EM | Admit: 2024-02-04 | Discharge: 2024-02-05 | Disposition: A | Discharge status: Home / Self Care | Payer: Self-pay | Attending: Emergency Medicine | Admitting: Emergency Medicine

## 2024-02-04 DIAGNOSIS — O2301 Infections of kidney in pregnancy, first trimester: Secondary | ICD-10-CM

## 2024-02-04 DIAGNOSIS — O26891 Other specified pregnancy related conditions, first trimester: Secondary | ICD-10-CM | POA: Insufficient documentation

## 2024-02-04 DIAGNOSIS — R509 Fever, unspecified: Secondary | ICD-10-CM | POA: Insufficient documentation

## 2024-02-04 DIAGNOSIS — Z3491 Encounter for supervision of normal pregnancy, unspecified, first trimester: Secondary | ICD-10-CM

## 2024-02-04 DIAGNOSIS — Z3A01 Less than 8 weeks gestation of pregnancy: Secondary | ICD-10-CM | POA: Insufficient documentation

## 2024-02-04 DIAGNOSIS — R109 Unspecified abdominal pain: Secondary | ICD-10-CM | POA: Insufficient documentation

## 2024-02-04 DIAGNOSIS — N12 Tubulo-interstitial nephritis, not specified as acute or chronic: Secondary | ICD-10-CM | POA: Insufficient documentation

## 2024-02-04 LAB — COMPREHENSIVE METABOLIC PANEL WITH GFR
ALT: 14 U/L (ref 0–44)
AST: 20 U/L (ref 15–41)
Albumin: 4.5 g/dL (ref 3.5–5.0)
Alkaline Phosphatase: 105 U/L (ref 38–126)
Anion gap: 14 (ref 5–15)
BUN: 9 mg/dL (ref 6–20)
CO2: 19 mmol/L — ABNORMAL LOW (ref 22–32)
Calcium: 9.2 mg/dL (ref 8.9–10.3)
Chloride: 99 mmol/L (ref 98–111)
Creatinine, Ser: 0.63 mg/dL (ref 0.44–1.00)
GFR, Estimated: >60 mL/min (ref >60)
Glucose, Bld: 109 mg/dL — ABNORMAL HIGH (ref 70–99)
Potassium: 3.6 mmol/L (ref 3.5–5.1)
Sodium: 132 mmol/L — ABNORMAL LOW (ref 135–145)
Total Bilirubin: 0.9 mg/dL (ref 0.0–1.2)
Total Protein: 7.4 g/dL (ref 6.5–8.1)

## 2024-02-04 LAB — CBC WITH DIFFERENTIAL/PLATELET
Abs Immature Granulocytes: 0.03 K/uL (ref 0.00–0.07)
Basophils Absolute: 0 K/uL (ref 0.0–0.1)
Basophils Relative: 0 %
Eosinophils Absolute: 0 K/uL (ref 0.0–0.5)
Eosinophils Relative: 0 %
HCT: 37.3 % (ref 36.0–46.0)
Hemoglobin: 12.4 g/dL (ref 12.0–15.0)
Immature Granulocytes: 0 %
Lymphocytes Relative: 9 %
Lymphs Abs: 0.8 K/uL (ref 0.7–4.0)
MCH: 27.4 pg (ref 26.0–34.0)
MCHC: 33.2 g/dL (ref 30.0–36.0)
MCV: 82.5 fL (ref 80.0–100.0)
Monocytes Absolute: 0.5 K/uL (ref 0.1–1.0)
Monocytes Relative: 6 %
Neutro Abs: 7.3 K/uL (ref 1.7–7.7)
Neutrophils Relative %: 85 %
Platelets: 258 K/uL (ref 150–400)
RBC: 4.52 MIL/uL (ref 3.87–5.11)
RDW: 12.7 % (ref 11.5–15.5)
WBC: 8.5 K/uL (ref 4.0–10.5)
nRBC: 0 % (ref 0.0–0.2)

## 2024-02-04 LAB — URINALYSIS, W/ REFLEX TO CULTURE (INFECTION SUSPECTED)
Bilirubin Urine: NEGATIVE
Glucose, UA: NEGATIVE mg/dL
Ketones, ur: 20 mg/dL — AB
Nitrite: NEGATIVE
Protein, ur: NEGATIVE mg/dL
Specific Gravity, Urine: 1.008 (ref 1.005–1.030)
pH: 6 (ref 5.0–8.0)

## 2024-02-04 LAB — RESP PANEL BY RT-PCR (RSV, FLU A&B, COVID)  RVPGX2
Influenza A by PCR: NEGATIVE
Influenza B by PCR: NEGATIVE
Resp Syncytial Virus by PCR: NEGATIVE
SARS Coronavirus 2 by RT PCR: NEGATIVE

## 2024-02-04 LAB — HCG, SERUM, QUALITATIVE: Preg, Serum: POSITIVE — AB

## 2024-02-04 LAB — HCG, QUANTITATIVE, PREGNANCY: hCG, Beta Chain, Quant, S: 31763 m[IU]/mL — ABNORMAL HIGH (ref <5)

## 2024-02-04 LAB — I-STAT CG4 LACTIC ACID, ED: Lactic Acid, Venous: 1.2 mmol/L (ref 0.5–1.9)

## 2024-02-04 MED ORDER — SODIUM CHLORIDE 0.9 % IV SOLN
2.0000 g | Freq: Once | INTRAVENOUS | Status: AC
  Administered 2024-02-04: 2 g via INTRAVENOUS
  Filled 2024-02-04: qty 20

## 2024-02-04 MED ORDER — ACETAMINOPHEN 325 MG PO TABS
650.0000 mg | ORAL_TABLET | Freq: Once | ORAL | Status: AC
  Administered 2024-02-04: 650 mg via ORAL
  Filled 2024-02-04: qty 2

## 2024-02-04 MED ORDER — LACTATED RINGERS IV BOLUS
1000.0000 mL | Freq: Once | INTRAVENOUS | Status: AC
  Administered 2024-02-04: 1000 mL via INTRAVENOUS

## 2024-02-04 NOTE — ED Provider Notes (Signed)
 [place] EMERGENCY DEPARTMENT AT [facility] Provider Note   CSN: [id number] Arrival date & time: 02/04/24  1948     Patient presents with: Flank Pain   Tricia Mayo is a 23 y.o. female.  Patient with past history significant for pyelonephritis, nephrolithiasis, and prior nephrostomy tube placement presents to the emergency department concerns of flank pain.  Reports she has had ongoing primarily left-sided flank pain for the last week.  States that the pain feels that it is rating towards her lower abdomen as well as her right breast.  She endorses nausea but denies any vomiting or diarrhea.  Has had fevers off and on today at home and has been try to manage with Tylenol  with improvement when she takes this but has not taken it for several hours.  She believes that she is currently about [redacted] weeks pregnant.  She reports that with prior pregnancies, has had kidney stone problems develop but no issues outside of pregnancy.    Flank Pain       Prior to Admission medications   Medication Sig Start Date End Date Taking? Authorizing Provider  cephALEXin  (KEFLEX ) 500 MG capsule Take 1 capsule (500 mg total) by mouth 4 (four) times daily for 10 days. 02/05/24 02/15/24 Yes Shantese Raven A, PA-C  acetaminophen  (TYLENOL ) 325 MG tablet Take 2 tablets (650 mg total) by mouth every 4 (four) hours as needed for mild pain or moderate pain (for pain scale < 4  OR  temperature  >/=  100.5 F). 03/29/21   Rosalva Sawyer, MD  ibuprofen  (ADVIL ) 600 MG tablet Take 1 tablet (600 mg total) by mouth every 6 (six) hours. 05/14/21   [place] , Jade, FNP  prenatal vitamin w/FE, FA (PRENATAL 1 + 1) 27-1 MG TABS tablet Take 1 tablet by mouth daily at 12 noon.    [provider]    Allergies: Patient has no known allergies.    Review of Systems  Genitourinary:  Positive for flank pain.  All other systems reviewed and are negative.   Updated Vital Signs BP 117/63   Pulse 94   Temp 98.6 F (37 C)  (Oral)   Resp 15   SpO2 96%   Physical Exam Vitals and nursing note reviewed.  Constitutional:      General: She is not in acute distress.    Appearance: Normal appearance. She is well-developed.  HENT:     Head: Normocephalic and atraumatic.  Eyes:     Conjunctiva/sclera: Conjunctivae normal.  Cardiovascular:     Rate and Rhythm: Normal rate and regular rhythm.     Heart sounds: No murmur heard. Pulmonary:     Effort: Pulmonary effort is normal. No respiratory distress.     Breath sounds: Normal breath sounds.  Abdominal:     General: There is no distension.     Palpations: Abdomen is soft.     Tenderness: There is left CVA tenderness. There is no right CVA tenderness or guarding.  Musculoskeletal:        General: No swelling.     Cervical back: Neck supple.  Skin:    General: Skin is warm and dry.     Capillary Refill: Capillary refill takes less than 2 seconds.  Neurological:     Mental Status: She is alert.  Psychiatric:        Mood and Affect: Mood normal.     (all labs ordered are listed, but only abnormal results are displayed) Labs Reviewed  COMPREHENSIVE METABOLIC  PANEL WITH GFR - Abnormal; Notable for the following components:      Result Value   Sodium 132 (*)    CO2 19 (*)    Glucose, Bld 109 (*)    All other components within normal limits  URINALYSIS, W/ REFLEX TO CULTURE (INFECTION SUSPECTED) - Abnormal; Notable for the following components:   Color, Urine STRAW (*)    Hgb urine dipstick SMALL (*)    Ketones, ur 20 (*)    Leukocytes,Ua MODERATE (*)    Bacteria, UA RARE (*)    All other components within normal limits  HCG, QUANTITATIVE, PREGNANCY - Abnormal; Notable for the following components:   hCG, Beta Chain, Quant, S 31,763 (*)    All other components within normal limits  HCG, SERUM, QUALITATIVE - Abnormal; Notable for the following components:   Preg, Serum POSITIVE (*)    All other components within normal limits  RESP PANEL BY RT-PCR  (RSV, FLU A&B, COVID)  [id number]  URINE CULTURE  CBC WITH DIFFERENTIAL/PLATELET  I-STAT CG4 LACTIC ACID, ED  I-STAT CG4 LACTIC ACID, ED    EKG: EKG Interpretation Date/Time:  Monday February 04 2024 20:40:40 EDT Ventricular Rate:  109 PR Interval:  140 QRS Duration:  82 QT Interval:  304 QTC Calculation: 410 R Axis:   73  Text Interpretation: Sinus tachycardia nonspecific ST changes No old tracing to compare Confirmed by Freddi Hamilton [id number]) on 02/04/2024 9:00:14 PM  Radiology: US  OB Comp Less 14 Wks Result Date: 02/04/2024 CLINICAL DATA:  Abdomen pain EXAM: OBSTETRIC <14 WK ULTRASOUND TECHNIQUE: Transabdominal ultrasound was performed for evaluation of the gestation as well as the maternal uterus and adnexal regions. COMPARISON:  None Available. FINDINGS: Intrauterine gestational sac: Single intrauterine gestational sac Yolk sac:  Visualized Embryo:  Visualized Cardiac Activity: Visualized Heart Rate: 136 bpm CRL:   7 mm   6 w 4 d                  US  EDC: 09/24/2024 Subchorionic hemorrhage:  Small subchorionic hemorrhage Maternal uterus/adnexae: Ovaries are within normal limits. Left ovary measures 2.7 by 1.3 x 2.5 cm. Right ovary measures 2.2 x 2 x 3.1 cm. No significant free fluid IMPRESSION: Single intrauterine gestation with cardiac activity. Small subchorionic hemorrhage Electronically Signed   By: Luke Bun M.D.   On: 02/04/2024 23:51   US  Renal Result Date: 02/04/2024 CLINICAL DATA:  Bilateral flank pain x1 week. EXAM: RENAL / URINARY TRACT ULTRASOUND COMPLETE COMPARISON:  June 07, 2021 FINDINGS: Right Kidney: Renal measurements: 11.9 cm x 4.7 cm x 5.3 cm = volume: 156.6 mL. Echogenicity within normal limits. A 2.7 mm nonobstructing renal calculus is seen within the upper pole of the right kidney. No mass or hydronephrosis visualized. Left Kidney: Renal measurements: 11.7 cm x 6.5 cm x 6.6 cm = volume: 261.4 mL. Echogenicity within normal limits. A 5.5 mm nonobstructing renal  calculus is seen within the upper pole of the left kidney. No mass or hydronephrosis visualized. Bladder: Appears normal for degree of bladder distention. Other: Of incidental note is the presence of a gestational sac within the uterus. IMPRESSION: 1. Bilateral subcentimeter nonobstructing renal calculi. 2. Gestational sac within the uterus. Correlation with pelvic ultrasound is recommended. Electronically Signed   By: Suzen Dials M.D.   On: 02/04/2024 21:52     Procedures   Medications Ordered in the ED  cefTRIAXone  (ROCEPHIN ) 2 g in sodium chloride  0.9 % 100 mL IVPB (2 g Intravenous New Bag/Given  02/04/24 2359)  acetaminophen  (TYLENOL ) tablet 650 mg (650 mg Oral Given 02/04/24 2034)  lactated ringers  bolus 1,000 mL (0 mLs Intravenous Stopped 02/04/24 2358)                                    Medical Decision Making Amount and/or Complexity of Data Reviewed Labs: ordered. Radiology: ordered.  Risk OTC drugs.   This patient presents to the ED for concern of flank pain.  Differential diagnosis includes pyelonephritis, ectopic pregnancy, UTI, cervicitis, PID   Lab Tests:  I Ordered, and personally interpreted labs.  The pertinent results include: CBC unremarkable with no leukocytosis, CMP reassuring without any signs of renal impairment, hCG quantitative at 31,763, respiratory panel negative, lactic acid unremarkable at 1.4, UA consistent with infection with some bacteria and moderate leukocytes seen as well as some blood present.  No nitrites present.  Urine culture pending.   Imaging Studies ordered:  I ordered imaging studies including ultrasound renal, ultrasound OB 14 weeks I independently visualized and interpreted imaging which showed renal ultrasound negative for any acute findings with no evidence of hydronephrosis, OB ultrasound estimated gestational age of approximately 6 weeks of a live intrauterine pregnancy.  Small subchorionic hemorrhage noted. I agree with the  radiologist interpretation   Medicines ordered and prescription drug management:  I ordered medication including Tylenol , Rocephin , fluid bolus for fever, pyelonephritis, dehydration Reevaluation of the patient after these medicines showed that the patient improved I have reviewed the patients home medicines and have made adjustments as needed   Problem List / ED Course:  Patient presents to the ED with concerns of flank pain. She reports ongoing left flank pain for the last week.  Patient has prior history of pyelonephritis and urolithiasis and has previously had a nephrostomy tube placed due to concerns of urinary retention.  She has (currently about [redacted] weeks pregnant.  She has previously had these episodes occur in the setting of her pregnancy and not outside of pregnancy.  No reported nausea, vomiting, or diarrhea.  No sick contacts as far she is aware. Physical exam reveals left CVA tenderness.  Right flank unremarkable.  No abdominal tenderness on my exam.  Normal bowel sounds.  Normal heart and lung sounds. Workup reveals no leukocytosis and no lactic acidosis.  UA consistent with likely infection with moderate leukocytes and rare bacteria seen but no obvious contamination of the sample.  Nitrite negative.  CMP with no renal abnormality.  hCG elevated at 31,763 and will confirm with OB ultrasound.  Respiratory panel negative.  Given patient's current status of pregnancy, discussed CT imaging versus ultrasound.  Will proceed with ultrasound imaging at this time. Ultrasound negative for any acute findings with no hydronephrosis visualized.  Bilateral subcentimeter nonobstructing renal calculi seen.  OB ultrasound measures approximately 6-week and 4 days gestational sac.  Fetal heart detected at approximately 140 bpm.  Small subchorionic hemorrhage present. On combination of findings, symptoms, and presentation, concern for pyelonephritis.  Will initiate antibiotic therapy with 2 g of Rocephin   given here in the emergency department.  Urine culture obtained prior to initiation of antibiotics. Patient tolerated antibiotic therapy without difficulty.  Will continue outpatient biotics with Keflex  4 times daily for the next 10 days.  Encourage patient establish care with an OB/GYN for evaluation of this pregnancy and to follow-up with urology for further assessment given significant renal history in the setting of pregnancy.  Patient  verbalized understanding and agreement.  Otherwise stable at this time for outpatient follow-up and discharged home.   Social Determinants of Health:  None  Final diagnoses:  Pyelonephritis affecting pregnancy in first trimester  First trimester pregnancy    ED Discharge Orders          Ordered    cephALEXin  (KEFLEX ) 500 MG capsule  4 times daily        02/05/24 0015               Pearlie Lafosse A, PA-C 02/05/24 BEATRIS Freddi Hamilton, MD 02/07/24 [phone]

## 2024-02-04 NOTE — ED Notes (Addendum)
 Lab has been called to run urine culture since sample was already sent down earlier

## 2024-02-04 NOTE — ED Triage Notes (Addendum)
 Patient c/o flank pain x 1 week. Patient report pain is radiating to her lower abdomen and left breast. Patient report nausea, denies vomiting / diarrhea. Patient report fever at home. Patient report she found out she was pregnant [redacted] weeks ago. Patient denies vaginal bleeding. Patient report urinary frequency.

## 2024-02-05 LAB — I-STAT CG4 LACTIC ACID, ED: Lactic Acid, Venous: 1.4 mmol/L (ref 0.5–1.9)

## 2024-02-05 MED ORDER — CEPHALEXIN 500 MG PO CAPS: 500.0000 mg | ORAL_CAPSULE | Freq: Four times a day (QID) | ORAL | 0 refills | Status: AC

## 2024-02-05 NOTE — Discharge Instructions (Addendum)
 You are seen in the emergency department today for concerns of flank pain.  You are found to have infection in your kidney but no signs of a kidney stone.  You also had an ultrasound performed today to evaluate your current pregnancy and you are estimated to be approximately [redacted] weeks pregnant.  Please plan on following up with your urologist for further evaluation as well as your OB/GYN for further assessment of your pregnancy.  For any concerns or worsening symptoms, return to the emergency department.

## 2024-02-06 LAB — URINE CULTURE: Special Requests: NORMAL
